# Patient Record
Sex: Male | Born: 1935 | Race: White | Hispanic: No | Marital: Married
Health system: Midwestern US, Academic
[De-identification: ages and names within clinical notes are randomized; demographics above are authoritative.]

---

## 2017-05-07 MED ORDER — PRADAXA 150 MG PO CAP
ORAL_CAPSULE | Freq: Two times a day (BID) | 11 refills | Status: DC
Start: 2017-05-07 — End: 2018-05-13

## 2017-08-12 ENCOUNTER — Encounter: Admit: 2017-08-12 | Discharge: 2017-08-12 | Payer: MEDICARE

## 2017-08-12 MED ORDER — METOPROLOL TARTRATE 50 MG PO TAB
ORAL_TABLET | Freq: Two times a day (BID) | ORAL | 3 refills | 90.00000 days | Status: AC
Start: 2017-08-12 — End: 2018-07-28

## 2017-08-25 ENCOUNTER — Encounter: Admit: 2017-08-25 | Discharge: 2017-08-25 | Payer: MEDICARE

## 2017-08-26 MED ORDER — ATORVASTATIN 40 MG PO TAB
ORAL_TABLET | Freq: Every day | 3 refills | Status: AC
Start: 2017-08-26 — End: 2018-09-23

## 2017-09-03 ENCOUNTER — Encounter: Admit: 2017-09-03 | Discharge: 2017-09-03 | Payer: MEDICARE

## 2017-09-03 DIAGNOSIS — E785 Hyperlipidemia, unspecified: Secondary | ICD-10-CM

## 2017-09-03 DIAGNOSIS — I480 Paroxysmal atrial fibrillation: ICD-10-CM

## 2017-09-03 DIAGNOSIS — I10 Essential (primary) hypertension: Principal | ICD-10-CM

## 2017-09-09 ENCOUNTER — Encounter: Admit: 2017-09-09 | Discharge: 2017-09-09 | Payer: MEDICARE

## 2017-09-09 DIAGNOSIS — I480 Paroxysmal atrial fibrillation: ICD-10-CM

## 2017-09-09 DIAGNOSIS — E785 Hyperlipidemia, unspecified: Secondary | ICD-10-CM

## 2017-09-09 DIAGNOSIS — I10 Essential (primary) hypertension: Principal | ICD-10-CM

## 2017-09-09 LAB — HEMOGLOBIN A1C: Lab: 7.6 — ABNORMAL HIGH (ref 4.5–6.5)

## 2017-09-09 LAB — LIPID PROFILE
Lab: 123
Lab: 114 — ABNORMAL LOW (ref 150–200)

## 2017-09-09 LAB — BASIC METABOLIC PANEL
Lab: 8.8
Lab: 1.1
Lab: 105
Lab: 142
Lab: 15 — ABNORMAL HIGH (ref 0–14)
Lab: 161 — ABNORMAL HIGH (ref 83–110)
Lab: 23
Lab: 26
Lab: 3.7

## 2017-09-09 LAB — ALT (SGPT): Lab: 16

## 2017-09-19 ENCOUNTER — Encounter: Admit: 2017-09-19 | Discharge: 2017-09-19 | Payer: MEDICARE

## 2017-09-19 ENCOUNTER — Ambulatory Visit: Admit: 2017-09-19 | Discharge: 2017-09-20 | Payer: MEDICARE

## 2017-09-19 DIAGNOSIS — E119 Type 2 diabetes mellitus without complications: ICD-10-CM

## 2017-09-19 DIAGNOSIS — N400 Enlarged prostate without lower urinary tract symptoms: ICD-10-CM

## 2017-09-19 DIAGNOSIS — I4891 Unspecified atrial fibrillation: ICD-10-CM

## 2017-09-19 DIAGNOSIS — R42 Dizziness and giddiness: Principal | ICD-10-CM

## 2017-09-19 DIAGNOSIS — E785 Hyperlipidemia, unspecified: Secondary | ICD-10-CM

## 2017-09-19 DIAGNOSIS — I10 Essential (primary) hypertension: ICD-10-CM

## 2017-09-19 DIAGNOSIS — I4821 Permanent atrial fibrillation: Principal | ICD-10-CM

## 2017-09-19 NOTE — Progress Notes
Date of Service: 09/19/2017    Miguel Howe is a 83 y.o. male.       HPI     [name] is followed for persistent atrial fibrillation/flutter.    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    Otherwise, over the past 6???months he has been stable. I am not sure that he has any symptoms related to his atrial fibrillation. He checks his blood pressure and pule at home daily and his computer keeps a running average of his heart rate and pulse.  His blood pressure has been a little lower than previous probably because he is watching his salt intake very well.  He keeps a running average of his blood pressure readings and his systolic blood pressure has been averaging 115 mmHg with a pulse of 80 bpm.???He has felt well and reports no angina, congestive symptoms, palpitations, sensation of sustained forceful heart pounding, lightheadedness or syncope. His exercise tolerance has been stable. He is a competitive Psychiatrist and usually gets a lot of exercise walking back and forth between horseshoe poles. 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 He even has a practice area in his basement for pitching horseshoes.  However the patient states that he is cutting back on his horseshoe pitching somewhat, because long matches become tiresome to him.??????[name] is also active bowling and golfing.  In the summer he golfs twice a week.  The patient reports no myalgias, bleeding abnormalities, neurologic motor abnormalities or difficulty with speech. [name] reports no recent hospitalizations or emergency room visits over the past 6 months. He has been tolerating his medications without difficulty.   ???  Historically, [name] presented to medical attention on August 08, 2010 when he noticed unsteadiness and dizziness while playing golf. He reports that the first 9 holes of golf that he played were unremarkable, and that he became unsteady when he started playing the back 9. At the time he believes that he may have had some focal weakness in his left leg and some numbness in his left leg as well. This persisted only for 10-15 minutes and he believes that he did not mention it in the Emergency Department because he has intermittent sciatica and lower back pain and attributed the abnormality to his chronic back disorder. He stopped playing golf and went to the Emergency Department and was seen in the Emergency Department at approximately 12:45 that afternoon. He was noted to have supraventricular tachycardia with a rate of 168 beats per minute. The Emergency Department note indicated that his supraventricular tachycardia converted spontaneously with an involuntary Valsalva that occurred while an IV was being placed. Mr. Tomek reported no other focal neurologic abnormalities. He reported no change in vision or difficulty with speech. 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 When I saw [name] on 08/15/10 his BP was elevated and he was started on losartan 50 mg daily. His BP was still elevated on follow-up and his losartan was increased to 100 mg daily. On 08/23/10 he reported intermittent tingling involving his left hand and he was hospitalized at [facility] for stroke evaluation. Prior to discharge on 08/26/10 he was noted to have paroxysmal atrial flutter and he was started on Pradaxa. [name] reported some transient  hematuria in October 2011. He reports that he was seen by his urologist and his hematuria resolved. When I saw [name] on 03/16/11 I noticed that his BP had been lower and he reported that he felt somewhat lethargic and orthostatic when his BP was lower. His Maxzide was stopped. Also his blood sugar was markedly elevated at 555 mg/dl. His metformin was increased and he markedly decreased his carbohydrate intake.   While [name] is unclear clinically whether he has had a stroke, a CT head scan from 08/24/10 showed MILD INVOLUTIONAL CHANGES AND SMALL SUBACUTE TO CHRONIC RIGHT BASAL GANGLIA LACUNAR INFARCT. In addition, the neurology discharge note from 08/27/10 lists subacute to chronic stroke as a discharge diagnosis. This along with age, hypertension and diabetes would yield a CHA2DS2-VASc score of 6. 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 ???When he was seen on 10/06/15 he was noted to be in atrial fibrillation. Treatment alternatives were presented to the patient and he wanted to proceed with cardioversion. Cardioversion was attempted on 10/10/15 but was unsuccessful. ???He has selected a strategy of heart rate control and anticoagulation and does not want to attempt repeat cardioversion or consider antiarrhythmic therapy or arrhythmia ablation.         Vitals:    09/19/17 1042 09/19/17 1052   BP: 106/74 110/78   Pulse: 91    Weight: 95.4 kg (210 lb 6.4 oz)    Height: 1.803 m (5' 11)      Body mass index is 29.34 kg/m???.     Past Medical History  Patient Active Problem List    Diagnosis Date Noted   ??? Paroxysmal atrial flutter (HCC) 07/27/2014   ??? Left arm numbness 08/23/2010   ??? Arrhythmia 08/23/2010   ??? HTN (hypertension) 08/23/2010   ??? Dizziness 08/15/2010   ??? HTN (hypertension) 08/15/2010   ??? Hyperlipoproteinemia 08/15/2010   ??? Atrial fibrillation (HCC) 08/15/2010   ??? Diabetes (HCC) 08/15/2010   ??? BPH (benign prostatic hypertrophy) 08/15/2010   ??? Dyslipidemia 08/15/2010         Review of Systems   Constitution: Positive for weight gain.   HENT: Negative.    Eyes: Negative.    Cardiovascular: Positive for dyspnea on exertion.   Endocrine: Negative.    Hematologic/Lymphatic: Negative.    Skin: Negative.    Musculoskeletal: Negative.    Gastrointestinal: Negative. Genitourinary: Negative.    Neurological: Negative.    Psychiatric/Behavioral: Negative.    Allergic/Immunologic: Negative.        Physical Exam  GENERAL: The patient is well developed, well nourished, resting comfortably and in no distress. ???  HEENT: No abnormalities of the visible oro-nasopharynx, conjunctiva or sclera are noted. ???  NECK: There is no jugular venous distension. Carotids are palpable and without bruits. There is no thyroid enlargement. ???  Chest: Lung fields are clear to auscultation. There are no wheezes or crackles. ???  CV: There is an??? irregular rhythm with an apical rate of 84???BPM. Variable intensity of S1. There are no murmurs, gallops or rubs. ???  ABD: The abdomen is soft and supple with normal bowel sounds. There is no hepatosplenomegaly, ascites, tenderness, masses or bruits. Ventral hernia noted.  Neuro: There are no focal motor defects. Ambulation is normal. Cognitive function appears normal. ???  Ext:???There is trace bipedal edema without evidence of deep vein thrombosis. Peripheral pulses are satisfactory. ???  SKIN:???There are no rashes and no cellulitis ???  PSYCH:???The patient is calm, rationale and oriented.     Cardiovascular Studies  A 12-lead ECG obtained on 09/19/2017 reveals atrial fibrillation with a heart rate of 91 bpm.  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  No significant changes are noted when compared to a prior ECG obtained on March 05, 2017.  Labs from 09/09/2017 reveals total cholesterol 114, triglycerides 123, HDL 37, and LDL 56 mg/dL.  His ALT is 16 and his hemoglobin A1c is 7.6%.  Serum potassium is 3.7 mmol/L.    Problems Addressed Today  Permanent atrial fibrillation  Assessment and Plan     Treatment options for the management of hypertension were reviewed with the patient he wanted to stop his amlodipine because his blood pressure has been low, although he has not been orthostatic and has had no falls. I once again discussed treatment alternatives with Mr. Kuper which included antiarrhythmic therapy, cardioversion, arrhythmia ablation, and heart rate control with anticoagulation. Mr. Tessmann feels that he is asymptomatic with respect to his atrial fibrillation and does not want to consider antiarrhythmic therapy, repeat cardioversion, or arrhythmia ablation. He prefers a strategy of heart rate control and anticoagulation. Currently he is taking Pradaxa and his heart rate is controlled well.??????The risks and benefits of anticoagulation therapy have been reviewed with the patient. The patient understands that anticoagulation is used to decrease thrombotic or clotting complications associated with atrial fibrillation/flutter, such as stroke and systemic embolization which can be disabling or fatal, but can, on occasion, lead to life-threatening bleeding complications including gastrointestinal and intracranial hemorrhage. The patient wishes to continue with anticoagulation. Anticoagulation options were presented to the patient which included warfarin or one of the newer direct acting oral anticoagulants and the patient wanted to continue taking Pradaxa since he has been tolerating this without adverse effects. He indicated that he will continue to work with his primary care physician for management of his diabetes mellitus and blood sugars.  I have asked Mr. Depa to return for follow-up in 6 months so that I can follow his progress.         Current Medications (including today's revisions)  ??? amoxicillin (AMOXIL) 500 mg capsule Take 500 mg by mouth. Take as needed for dental appt.    ??? atorvastatin (LIPITOR) 40 mg tablet TAKE ONE TABLET BY MOUTH ONCE DAILY   ??? clotrimazole-betamethasone (LOTRISONE) 1-0.05 % topical cream Apply  topically to affected area daily.   ??? coenzyme Q10(+) 100 mg PO Cap Take 200 mg by mouth daily.   ??? finasteride (PROSCAR) 5 mg tablet Take 5 mg by mouth daily. ??? glimepiride (AMARYL) 4 mg tablet Take 6 mg by mouth daily.   ??? losartan-hydrochlorothiazide (HYZAAR) 100-25 mg tablet TAKE ONE TABLET BY MOUTH ONCE DAILY   ??? metoprolol tartrate (LOPRESSOR) 50 mg tablet TAKE ONE TABLET BY MOUTH TWICE DAILY   ??? MULTIVITAMIN W-MINERALS/LUTEIN (CENTRUM SILVER PO) Take  by mouth daily.   ??? pioglitazone (ACTOS) 30 mg tablet Take 30 mg by mouth daily.   ??? PRADAXA 150 mg capsule TAKE ONE CAPSULE BY MOUTH TWICE DAILY   ??? tamsulosin (FLOMAX) 0.4 mg capsule Take 0.4 mg by mouth daily.

## 2017-11-22 ENCOUNTER — Encounter: Admit: 2017-11-22 | Discharge: 2017-11-22 | Payer: MEDICARE

## 2017-11-22 MED ORDER — LOSARTAN-HYDROCHLOROTHIAZIDE 100-25 MG PO TAB
ORAL_TABLET | Freq: Every day | ORAL | 3 refills | 28.00000 days | Status: AC
Start: 2017-11-22 — End: 2018-11-24

## 2018-04-22 ENCOUNTER — Encounter: Admit: 2018-04-22 | Discharge: 2018-04-22 | Payer: MEDICARE

## 2018-04-22 ENCOUNTER — Ambulatory Visit: Admit: 2018-04-22 | Discharge: 2018-04-23 | Payer: MEDICARE

## 2018-04-22 DIAGNOSIS — R42 Dizziness and giddiness: ICD-10-CM

## 2018-04-22 DIAGNOSIS — N400 Enlarged prostate without lower urinary tract symptoms: ICD-10-CM

## 2018-04-22 DIAGNOSIS — I4891 Unspecified atrial fibrillation: ICD-10-CM

## 2018-04-22 DIAGNOSIS — I10 Essential (primary) hypertension: Principal | ICD-10-CM

## 2018-04-22 DIAGNOSIS — E119 Type 2 diabetes mellitus without complications: ICD-10-CM

## 2018-04-22 DIAGNOSIS — I4821 Permanent atrial fibrillation: ICD-10-CM

## 2018-04-22 DIAGNOSIS — E785 Hyperlipidemia, unspecified: Secondary | ICD-10-CM

## 2018-04-22 LAB — BASIC METABOLIC PANEL
Lab: 1.3 — ABNORMAL HIGH (ref 0.72–1.25)
Lab: 103
Lab: 138
Lab: 15 — ABNORMAL HIGH (ref 0–14)
Lab: 24
Lab: 25
Lab: 280 — ABNORMAL HIGH (ref 83–110)
Lab: 4
Lab: 8.9

## 2018-04-23 ENCOUNTER — Encounter: Admit: 2018-04-23 | Discharge: 2018-04-23 | Payer: MEDICARE

## 2018-04-23 DIAGNOSIS — I10 Essential (primary) hypertension: Principal | ICD-10-CM

## 2018-04-23 DIAGNOSIS — I4821 Permanent atrial fibrillation: ICD-10-CM

## 2018-04-23 DIAGNOSIS — R42 Dizziness and giddiness: ICD-10-CM

## 2018-05-06 ENCOUNTER — Ambulatory Visit: Admit: 2018-05-06 | Discharge: 2018-05-06 | Payer: MEDICARE

## 2018-05-06 DIAGNOSIS — I482 Chronic atrial fibrillation: ICD-10-CM

## 2018-05-06 DIAGNOSIS — I10 Essential (primary) hypertension: Principal | ICD-10-CM

## 2018-05-06 DIAGNOSIS — R42 Dizziness and giddiness: ICD-10-CM

## 2018-05-06 MED ORDER — PERFLUTREN LIPID MICROSPHERES 1.1 MG/ML IV SUSP
1-20 mL | Freq: Once | INTRAVENOUS | 0 refills | Status: CP | PRN
Start: 2018-05-06 — End: ?
  Administered 2018-05-06: 16:00:00 2 mL via INTRAVENOUS

## 2018-05-07 ENCOUNTER — Encounter: Admit: 2018-05-07 | Discharge: 2018-05-07 | Payer: MEDICARE

## 2018-05-10 ENCOUNTER — Encounter: Admit: 2018-05-10 | Discharge: 2018-05-10 | Payer: MEDICARE

## 2018-05-13 MED ORDER — PRADAXA 150 MG PO CAP
ORAL_CAPSULE | Freq: Two times a day (BID) | 11 refills | Status: AC
Start: 2018-05-13 — End: 2019-05-04

## 2018-05-15 ENCOUNTER — Encounter: Admit: 2018-05-15 | Discharge: 2018-05-15 | Payer: MEDICARE

## 2018-07-26 ENCOUNTER — Encounter: Admit: 2018-07-26 | Discharge: 2018-07-26 | Payer: MEDICARE

## 2018-07-28 MED ORDER — METOPROLOL TARTRATE 50 MG PO TAB
ORAL_TABLET | Freq: Two times a day (BID) | ORAL | 3 refills | 90.00000 days | Status: AC
Start: 2018-07-28 — End: 2019-08-18

## 2018-08-29 LAB — LIPID PROFILE
Lab: 124 — ABNORMAL LOW (ref 150–200)
Lab: 173
Lab: 34 — ABNORMAL LOW (ref 35–60)
Lab: 35
Lab: 4
Lab: 57

## 2018-08-29 LAB — HEMOGLOBIN A1C: Lab: 7.7 — ABNORMAL HIGH (ref 4.5–6.5)

## 2018-09-23 ENCOUNTER — Encounter: Admit: 2018-09-23 | Discharge: 2018-09-23 | Payer: MEDICARE

## 2018-09-23 MED ORDER — ATORVASTATIN 40 MG PO TAB
ORAL_TABLET | Freq: Every day | 1 refills | Status: AC
Start: 2018-09-23 — End: 2019-03-02

## 2018-11-24 ENCOUNTER — Encounter: Admit: 2018-11-24 | Discharge: 2018-11-24 | Payer: MEDICARE

## 2018-11-24 MED ORDER — LOSARTAN-HYDROCHLOROTHIAZIDE 100-25 MG PO TAB
ORAL_TABLET | Freq: Every day | ORAL | 3 refills | 28.00000 days | Status: AC
Start: 2018-11-24 — End: 2019-01-29

## 2019-01-28 LAB — LIPID PROFILE
Lab: 125 — ABNORMAL LOW (ref 150–200)
Lab: 126
Lab: 25 — ABNORMAL HIGH (ref 0.72–1.25)
Lab: 66 — ABNORMAL HIGH (ref 27.0–31.0)

## 2019-01-28 LAB — COMPREHENSIVE METABOLIC PANEL
Lab: 14
Lab: 142
Lab: 19
Lab: 24
Lab: 49 — ABNORMAL LOW (ref >59)

## 2019-01-28 LAB — CBC: Lab: 6.6

## 2019-01-28 LAB — THYROID STIMULATING HORMONE-TSH: Lab: 3.7

## 2019-01-29 ENCOUNTER — Encounter: Admit: 2019-01-29 | Discharge: 2019-01-29 | Payer: MEDICARE

## 2019-01-29 ENCOUNTER — Ambulatory Visit: Admit: 2019-01-29 | Discharge: 2019-01-29 | Payer: MEDICARE

## 2019-01-29 DIAGNOSIS — I4891 Unspecified atrial fibrillation: ICD-10-CM

## 2019-01-29 DIAGNOSIS — R42 Dizziness and giddiness: Principal | ICD-10-CM

## 2019-01-29 DIAGNOSIS — N400 Enlarged prostate without lower urinary tract symptoms: ICD-10-CM

## 2019-01-29 DIAGNOSIS — E785 Hyperlipidemia, unspecified: Secondary | ICD-10-CM

## 2019-01-29 DIAGNOSIS — E119 Type 2 diabetes mellitus without complications: ICD-10-CM

## 2019-01-29 DIAGNOSIS — I4821 Permanent atrial fibrillation: Principal | ICD-10-CM

## 2019-01-29 DIAGNOSIS — I10 Essential (primary) hypertension: ICD-10-CM

## 2019-01-29 NOTE — Progress Notes
believes that he may have had some focal weakness in his left leg and some numbness in his left leg as well. This persisted only for 10-15 minutes and he believes that he did not mention it in the Emergency Department because he has intermittent sciatica and lower back pain and attributed the abnormality to his chronic back disorder. He stopped playing golf and went to the Emergency Department and was seen in the Emergency Department at approximately 12:45 that afternoon. He was noted to have supraventricular tachycardia with a rate of 168 beats per minute. The Emergency Department note indicated that his supraventricular tachycardia converted spontaneously with an involuntary Valsalva that occurred while an IV was being placed. Mr. Pitner reported no other focal neurologic abnormalities. He reported no change in vision or difficulty with speech. 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 When I saw Mr. Denio on 08/15/10 his BP was elevated and he was started on losartan 50 mg daily. His BP was still elevated on follow-up and his losartan was increased to 100 mg daily. On 08/23/10 he reported intermittent tingling involving his left hand and he was hospitalized at [facility] for stroke evaluation. Prior to discharge on 08/26/10 he was noted to have paroxysmal atrial flutter and he was started on Pradaxa. Mr. Carreras reported some transient hematuria in October 2011. He reports that he was seen by his urologist and his hematuria resolved. When I saw Mr. Trulson on 03/16/11 I noticed that his BP had been lower and he reported that he felt somewhat lethargic and orthostatic when his BP was lower. His Maxzide was stopped. Also his blood sugar was markedly elevated at 555 mg/dl. His metformin was increased and he markedly decreased his carbohydrate intake. lower limits of normal. The estimated left ventricular ejection fraction is 50-55%. Left ventricular contractility appears similar when compared with the prior echocardiogram performed on 10/19/15.  2. There is mild concentric left ventricular hypertrophy.  3. Right ventricular contractility appears normal.  4. Moderate left atrial enlargement.  5. Mild mitral valve regurgitation.  6. Aortic valve sclerosis without stenosis.  7. No pericardial effusion is seen.    Problems Addressed Today  Permanent atrial fibrillation.  Assessment and Plan     Mr.Demmon has permanent atrial fibrillation and has declined rhythm control in preference for anticoagulation and heart rate control.  The patient reports that his average heart rate when he checks at home is 83 bpm.  He reports some mild gradual fatigue over time but his exercise tolerance remains greater than expected for his age. .For this patient a prior history of stroke, age, hypertension and diabetes would yield a CHA2DS2-VASc score of 6. The risks and benefits of anticoagulation therapy have been reviewed with the patient. The patient understands that anticoagulation is used to decrease thrombotic or clotting complications associated with atrial fibrillation/flutter, such as stroke and systemic embolization which can be disabling or fatal, but can, on occasion, lead to life-threatening bleeding complications including gastrointestinal or intracranial hemorrhage. The patient wishes to continue with anticoagulation. Anticoagulation options were presented to the patient which included warfarin or one of the newer direct acting oral anticoagulants and the patient wanted to continue Pradaxa. I spoke to Mr. Valeriano about reducing some of his antihypertensive medications because his blood pressure is occasionally low.  He indicated that he was feeling  very well, had infrequent orthostatic symptoms and did not want to change his

## 2019-02-22 ENCOUNTER — Encounter: Admit: 2019-02-22 | Discharge: 2019-02-22 | Payer: MEDICARE

## 2019-02-23 MED ORDER — LOSARTAN 100 MG PO TAB
ORAL_TABLET | Freq: Every day | ORAL | 3 refills | 30.00000 days | Status: AC
Start: 2019-02-23 — End: 2020-02-05

## 2019-02-23 MED ORDER — HYDROCHLOROTHIAZIDE 25 MG PO TAB
ORAL_TABLET | Freq: Every day | ORAL | 3 refills | 28.00000 days | Status: AC
Start: 2019-02-23 — End: 2020-02-05

## 2019-03-01 ENCOUNTER — Encounter: Admit: 2019-03-01 | Discharge: 2019-03-01 | Payer: MEDICARE

## 2019-03-02 MED ORDER — ATORVASTATIN 40 MG PO TAB
ORAL_TABLET | Freq: Every day | 3 refills | Status: AC
Start: 2019-03-02 — End: 2020-02-16

## 2019-05-03 ENCOUNTER — Encounter: Admit: 2019-05-03 | Discharge: 2019-05-03 | Payer: MEDICARE

## 2019-05-04 MED ORDER — PRADAXA 150 MG PO CAP
ORAL_CAPSULE | Freq: Two times a day (BID) | 11 refills | Status: DC
Start: 2019-05-04 — End: 2020-04-25

## 2019-08-04 ENCOUNTER — Encounter: Admit: 2019-08-04 | Discharge: 2019-08-04 | Payer: MEDICARE

## 2019-08-04 ENCOUNTER — Ambulatory Visit: Admit: 2019-08-04 | Discharge: 2019-08-05 | Payer: MEDICARE

## 2019-08-04 DIAGNOSIS — E785 Hyperlipidemia, unspecified: Secondary | ICD-10-CM

## 2019-08-04 DIAGNOSIS — I4891 Unspecified atrial fibrillation: Secondary | ICD-10-CM

## 2019-08-04 DIAGNOSIS — R42 Dizziness and giddiness: Secondary | ICD-10-CM

## 2019-08-04 DIAGNOSIS — N400 Enlarged prostate without lower urinary tract symptoms: Secondary | ICD-10-CM

## 2019-08-04 DIAGNOSIS — I10 Essential (primary) hypertension: Secondary | ICD-10-CM

## 2019-08-04 DIAGNOSIS — E119 Type 2 diabetes mellitus without complications: Secondary | ICD-10-CM

## 2019-08-04 DIAGNOSIS — I4892 Unspecified atrial flutter: Secondary | ICD-10-CM

## 2019-08-04 LAB — BASIC METABOLIC PANEL
Lab: 1.4 — ABNORMAL HIGH (ref 0.72–1.25)
Lab: 104 mg/dL
Lab: 141 mg/dL — ABNORMAL HIGH (ref <100)
Lab: 16 — ABNORMAL HIGH (ref 0–14)
Lab: 183 — ABNORMAL HIGH (ref 70–105)
Lab: 21
Lab: 25
Lab: 4 mg/dL
Lab: 8.7 — ABNORMAL LOW (ref 8.8–10.0)

## 2019-08-04 NOTE — Progress Notes
Date of Service: 08/04/2019    Miguel Howe is a 85 y.o. male.       HPI     [name] is followed for persistent atrial fibrillation/flutter.??????He reports no febrile or infectious symptoms recently.  Overall his exercise tolerance exceeds that expected for his age.  He is back to playing golf and hopes to resume his horseshoe activities within a short period of time. 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 ???Otherwise, over the past 6???months he has been stable. He checks his blood pressure and pulse at home daily and his computer keeps a running average of his heart rate and pulse. He keeps a running average of his blood pressure readings???and his blood pressure???has???been averaging 119/72???mmHg with a pulse of 80???bpm.???He has felt well and reports no angina, congestive symptoms, palpitations, sensation of sustained forceful heart pounding, lightheadedness or syncope. His exercise tolerance???may be very slowly decreasing with age. 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 He even has a practice area in his basement for pitching horseshoes. ???However the patient states that he is cutting back on his horseshoe pitching somewhat, because long matches become tiresome to him.??????Miguel Howe is also active bowling and golfing. The patient reports no myalgias, bleeding abnormalities, neurologic motor abnormalities or difficulty with speech. Miguel Howe reports no recent hospitalizations or emergency room visits over the past 6 months. He has been tolerating his medications without difficulty.   ???  Historically, [name] presented to medical attention on August 08, 2010 when he noticed unsteadiness and dizziness while playing golf. He reports that the first 9 holes of golf that he played were unremarkable, and that he became unsteady when he started playing the back 9. At the time he believes that he may have had some focal weakness in his left leg and some numbness in his left leg as well. This persisted only for 10-15 minutes and he believes that he did not mention it in the Emergency Department because he has intermittent sciatica and lower back pain and attributed the abnormality to his chronic back disorder. He stopped playing golf and went to the Emergency Department and was seen in the Emergency Department at approximately 12:45 that afternoon. He was noted to have supraventricular tachycardia with a rate of 168 beats per minute. The Emergency Department note indicated that his supraventricular tachycardia converted spontaneously with an involuntary Valsalva that occurred while an IV was being placed. [name] reported no other focal neurologic abnormalities. He reported no change in vision or difficulty with speech. 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 When I saw Miguel Howe on 08/15/10 his BP was elevated and he was started on losartan 50 mg daily. His BP was still elevated on follow-up and his losartan was increased to 100 mg daily. On 08/23/10 he reported intermittent tingling involving his left hand and he was hospitalized at [facility] for stroke evaluation. Prior to discharge on 08/26/10 he was noted to have paroxysmal atrial flutter and he was started on Pradaxa. Miguel Howe reported some transient hematuria in October 2011. He reports that  he was seen by his urologist and his hematuria resolved. When I saw [name] on 03/16/11 I noticed that his BP had been lower and he reported that he felt somewhat lethargic and orthostatic when his BP was lower. His Maxzide was stopped. Also his blood sugar was markedly elevated at 555 mg/dl. His metformin was increased and he markedly decreased his carbohydrate intake.   While Miguel Howe is unclear clinically whether he has had a stroke, a CT head scan from 08/24/10 showed MILD INVOLUTIONAL CHANGES AND SMALL SUBACUTE TO CHRONIC RIGHT BASAL GANGLIA LACUNAR INFARCT. In addition, the neurology discharge note from 08/27/10 lists subacute to chronic stroke as a discharge diagnosis. This along with age, hypertension and diabetes would yield a CHA2DS2-VASc score of 6. 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 ???When he was seen on 10/06/15 he was noted to be in atrial fibrillation. Treatment alternatives were presented to the patient and he wanted to proceed with cardioversion. Cardioversion was attempted on 10/10/15 but was unsuccessful. ???He has selected a strategy of heart rate control and anticoagulation and does not want to attempt repeat cardioversion or consider antiarrhythmic therapy or arrhythmia ablation.       Vitals:    08/04/19 1525 08/04/19 1538   BP: 122/76 126/74   BP Source: Arm, Left Upper Arm, Right Upper   Pulse: 80    Temp: 36.9 ???C (98.4 ???F)    SpO2: 97%    Weight: 92.4 kg (203 lb 9.6 oz)    Height: 1.791 m (5' 10.5)    PainSc: Zero      Body mass index is 28.8 kg/m???.     Past Medical History  Patient Active Problem List    Diagnosis Date Noted   ??? Paroxysmal atrial flutter (HCC) 07/27/2014   ??? Left arm numbness 08/23/2010   ??? Arrhythmia 08/23/2010   ??? HTN (hypertension) 08/23/2010   ??? Dizziness 08/15/2010   ??? HTN (hypertension) 08/15/2010   ??? Hyperlipoproteinemia 08/15/2010   ??? Atrial fibrillation (HCC) 08/15/2010   ??? Diabetes (HCC) 08/15/2010   ??? BPH (benign prostatic hypertrophy) 08/15/2010   ??? Dyslipidemia 08/15/2010         Review of Systems   Constitution: Negative.   HENT: Negative.    Eyes: Negative. Cardiovascular: Positive for dyspnea on exertion.   Respiratory: Negative.    Endocrine: Negative.    Hematologic/Lymphatic: Bruises/bleeds easily.   Skin: Negative.    Musculoskeletal: Positive for back pain.   Gastrointestinal: Negative.    Genitourinary: Negative.    Neurological: Positive for numbness and paresthesias.   Psychiatric/Behavioral: Negative.    Allergic/Immunologic: Negative.      Physical Exam  GENERAL: The patient is well developed, well nourished, resting comfortably and in no distress. ???  HEENT: No abnormalities of the visible oro-nasopharynx, conjunctiva or sclera are noted. ???  NECK: There is no jugular venous distension. Carotids are palpable and without bruits. There is no thyroid enlargement. ???  Chest: Lung fields are clear to auscultation. There are no wheezes or crackles. ???  CV: There is an??? irregular rhythm with an apical rate of 80???BPM. Variable intensity of S1. There are no murmurs, gallops or rubs. ???  ABD: The abdomen is soft and supple with normal bowel sounds. There is no hepatosplenomegaly, ascites, tenderness, masses or bruits. Ventral hernia noted.  Neuro: There are no focal motor defects. Ambulation is normal. Cognitive function appears normal. ???  Ext:???There is trace bipedal edema without evidence of deep vein thrombosis. Peripheral pulses are satisfactory. ???  SKIN:???There are no rashes and no cellulitis ???  PSYCH:???The patient is calm, rationale and oriented.    Cardiovascular Studies  A twelve-lead ECG obtained on 08/04/2019 reveals atrial fibrillation with a heart rate of 90 bpm.  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  There is no appreciable change when compared with a prior ECG obtained on January 29, 2019.    Labs from January 28, 2019 revealed serum potassium 3.7 mmol/L and serum creatinine 1.44 mg/dL.  His total cholesterol was 125, triglycerides 126, HDL 39 and LDL cholesterol 66 mg/dL.  His TSH was normal at 3.71 microunits/mL.  His ALT = 19. Based upon his age and serum creatinine of 1.44 mg/dL, his estimated creatinine clearance is 53 mL/min.       Echo Doppler 05/06/2018:  1. All left ventricular segments appear to contract at the lower limits of normal. ???Overall left ventricular systolic function appears to be at the lower limits of normal. The estimated left ventricular ejection fraction is 50-55%. Left ventricular contractility appears similar when compared with the prior echocardiogram performed on 10/19/15.  2. There is mild concentric left ventricular hypertrophy.  3. Right ventricular contractility appears normal.  4. Moderate left atrial enlargement.  5. Mild mitral valve regurgitation.  6. Aortic valve sclerosis without stenosis.  7. No pericardial effusion is seen    Problems Addressed Today  Permanent atrial fibrillation.  Hypertension.  Assessment and Plan     MiguelBowron???has permanent atrial fibrillation and has declined rhythm control in???preference for anticoagulation and heart rate control. ???The patient reports that his average heart rate when he checks at home is 80 bpm. ???He reports some mild gradual fatigue over time but his exercise tolerance remains greater than expected for his age. .For this???patient a prior history of stroke,???age, hypertension and diabetes would yield a CHA2DS2-VASc score of 6.???The risks and benefits of anticoagulation therapy have been reviewed with the patient. The patient understands that anticoagulation is used to decrease thrombotic or clotting complications associated with atrial fibrillation/flutter, such as stroke and systemic embolization which can be disabling or fatal, but can, on occasion, lead to life-threatening bleeding complications including???gastrointestinal or???intracranial hemorrhage. The patient wishes to???continue???with anticoagulation. Anticoagulation options were presented to the patient which included warfarin or one of the newer direct acting oral anticoagulants and the patient wanted to???continue Pradaxa.  He was given a requisition to check his Chem-7. I have asked Mr. Biskup to???return for follow-up in approximately 6 months time. ???       Current Medications (including today's revisions)  ??? amoxicillin (AMOXIL) 500 mg capsule Take 500 mg by mouth. Take as needed for dental appt.    ??? atorvastatin (LIPITOR) 40 mg tablet Take 1 tablet by mouth once daily   ??? clotrimazole-betamethasone (LOTRISONE) 1-0.05 % topical cream Apply  topically to affected area daily.   ??? coenzyme Q10(+) 100 mg PO Cap Take 200 mg by mouth daily.   ??? finasteride (PROSCAR) 5 mg tablet Take 5 mg by mouth daily.     ??? glimepiride (AMARYL) 4 mg tablet Take 8 mg by mouth daily.   ??? hydroCHLOROthiazide (HYDRODIURIL) 25 mg tablet Take 1 tablet by mouth once daily   ??? JARDIANCE 10 mg tablet Take 1 tablet by mouth daily.   ??? losartan (COZAAR) 100 mg tablet Take 1 tablet by mouth once daily   ??? metoprolol tartrate (LOPRESSOR) 50  mg tablet TAKE 1 TABLET BY MOUTH TWICE DAILY   ??? MULTIVITAMIN W-MINERALS/LUTEIN (CENTRUM SILVER PO) Take  by mouth daily.   ??? pioglitazone (ACTOS) 30 mg tablet Take 30 mg by mouth daily.   ??? PRADAXA 150 mg capsule Take 1 capsule by mouth twice daily   ??? tamsulosin (FLOMAX) 0.4 mg capsule Take 0.4 mg by mouth daily.

## 2019-08-06 ENCOUNTER — Encounter: Admit: 2019-08-06 | Discharge: 2019-08-06

## 2019-08-06 DIAGNOSIS — I4892 Unspecified atrial flutter: Secondary | ICD-10-CM

## 2019-08-06 DIAGNOSIS — I10 Essential (primary) hypertension: Secondary | ICD-10-CM

## 2019-08-10 ENCOUNTER — Encounter: Admit: 2019-08-10 | Discharge: 2019-08-10

## 2019-08-10 NOTE — Telephone Encounter
-----   Message from Nehemiah Massed, MD sent at 08/07/2019  4:29 PM CDT -----  Sherlon Handing: Mr. Vitrano's renal function appears stable.  His blood sugar is running high for him.  Please let him know.  Please forward lab results to his PCP.  Thanks.  SBG  ----- Message -----  From: Betsy Pries, RN  Sent: 08/06/2019  11:15 AM CDT  To: Nehemiah Massed, MD    Labs for your review.

## 2019-08-10 NOTE — Telephone Encounter
Results and recommendations called to patient.

## 2019-08-17 ENCOUNTER — Encounter: Admit: 2019-08-17 | Discharge: 2019-08-17

## 2019-08-18 MED ORDER — METOPROLOL TARTRATE 50 MG PO TAB
ORAL_TABLET | Freq: Two times a day (BID) | ORAL | 3 refills | 90.00000 days | Status: AC
Start: 2019-08-18 — End: ?

## 2020-02-05 ENCOUNTER — Encounter: Admit: 2020-02-05 | Discharge: 2020-02-05 | Payer: MEDICARE

## 2020-02-05 MED ORDER — LOSARTAN 100 MG PO TAB
ORAL_TABLET | Freq: Every day | ORAL | 3 refills | 30.00000 days | Status: DC
Start: 2020-02-05 — End: 2020-02-05

## 2020-02-05 MED ORDER — HYDROCHLOROTHIAZIDE 25 MG PO TAB
ORAL_TABLET | Freq: Every day | ORAL | 3 refills | 28.00000 days | Status: DC
Start: 2020-02-05 — End: 2020-02-05

## 2020-02-05 MED ORDER — HYDROCHLOROTHIAZIDE 25 MG PO TAB
ORAL_TABLET | Freq: Every day | ORAL | 3 refills | 28.00000 days | Status: AC
Start: 2020-02-05 — End: ?

## 2020-02-05 MED ORDER — LOSARTAN 100 MG PO TAB
ORAL_TABLET | Freq: Every day | ORAL | 3 refills | 30.00000 days | Status: AC
Start: 2020-02-05 — End: ?

## 2020-02-15 ENCOUNTER — Encounter: Admit: 2020-02-15 | Discharge: 2020-02-15 | Payer: MEDICARE

## 2020-02-16 ENCOUNTER — Encounter: Admit: 2020-02-16 | Discharge: 2020-02-16 | Payer: MEDICARE

## 2020-02-16 DIAGNOSIS — I4891 Unspecified atrial fibrillation: Secondary | ICD-10-CM

## 2020-02-16 DIAGNOSIS — I10 Essential (primary) hypertension: Secondary | ICD-10-CM

## 2020-02-16 DIAGNOSIS — R42 Dizziness and giddiness: Secondary | ICD-10-CM

## 2020-02-16 DIAGNOSIS — E119 Type 2 diabetes mellitus without complications: Secondary | ICD-10-CM

## 2020-02-16 DIAGNOSIS — E785 Hyperlipidemia, unspecified: Secondary | ICD-10-CM

## 2020-02-16 DIAGNOSIS — N400 Enlarged prostate without lower urinary tract symptoms: Secondary | ICD-10-CM

## 2020-02-16 DIAGNOSIS — I4892 Unspecified atrial flutter: Secondary | ICD-10-CM

## 2020-02-16 MED ORDER — ATORVASTATIN 40 MG PO TAB
ORAL_TABLET | Freq: Every day | ORAL | 3 refills | 90.00000 days | Status: AC
Start: 2020-02-16 — End: ?

## 2020-02-16 NOTE — Progress Notes
Date of Service: 02/16/2020    Miguel Howe is a 86 y.o. male.       HPI     [name] is followed for persistent atrial fibrillation/flutter.??He reports no febrile or infectious symptoms recently.  He has received his first Covid vaccine. Overall his exercise tolerance exceeds that expected for his age.  He has been an avid golfer and plans to start playing golf again this week as the weather becomes warm. 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 ?Otherwise, over the past 6?months he has been stable. He checks his blood pressure and pulse at home daily and his computer keeps a running average of his blood pressure and pulse.  His blood pressure averages 120/70 mmHg with a pulse of 80?bpm.?He has felt well and reports no angina, congestive symptoms, palpitations, sensation of sustained forceful heart pounding, lightheadedness or syncope. His exercise tolerance?may be very slowly decreasing with age but still is very good. ?The patient reports no myalgias, bleeding abnormalities, or strokelike symptoms.  [name] reports no recent hospitalizations or emergency room visits over the past 6 months. He has been tolerating his medications without difficulty.   ?  Historically, [name] presented to medical attention on August 08, 2010 when he noticed unsteadiness and dizziness while playing golf. He reports that the first 9 holes of golf that he played were unremarkable, and that he became unsteady when he started playing the back 9. At the time he believes that he may have had some focal weakness in his left leg and some numbness in his left leg as well. This persisted only for 10-15 minutes and he believes that he did not mention it in the Emergency Department because he has intermittent sciatica and lower back pain and attributed the abnormality to his chronic back disorder. He stopped playing golf and went to the Emergency Department and was seen in the Emergency Department at approximately 12:45 that afternoon. He was noted to have supraventricular tachycardia with a rate of 168 beats per minute. The Emergency Department note indicated that his supraventricular tachycardia converted spontaneously with an involuntary Valsalva that occurred while an IV was being placed. [name] reported no other focal neurologic abnormalities. He reported no change in vision or difficulty with speech. 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 When I saw [name] on 08/15/10 his BP was elevated and he was started on losartan 50 mg daily. His BP was still elevated on follow-up and his losartan was increased to 100 mg daily. On 08/23/10 he reported intermittent tingling involving his left hand and he was hospitalized at [facility] for stroke evaluation. Prior to discharge on 08/26/10 he was noted to have paroxysmal atrial flutter and he was started on Pradaxa. [name] reported some transient hematuria in October 2011. He reports that he was seen by his urologist and his hematuria resolved. When I saw [name] on 03/16/11 I noticed that his BP had been lower and he reported that he felt somewhat lethargic and orthostatic when his BP was lower. His Maxzide was stopped. Also his blood sugar was markedly elevated at 555  mg/dl. His metformin was increased and he markedly decreased his carbohydrate intake.   While [name] is unclear clinically whether he has had a stroke, a CT head scan from 08/24/10 showed MILD INVOLUTIONAL CHANGES AND SMALL SUBACUTE TO CHRONIC RIGHT BASAL GANGLIA LACUNAR INFARCT. In addition, the neurology discharge note from 08/27/10 lists subacute to chronic stroke as a discharge diagnosis. This along with age, hypertension and diabetes would yield a CHA2DS2-VASc score of 6. 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 ?When he was seen on 10/06/15 he was noted to be in atrial fibrillation. Treatment alternatives were presented to the patient and he wanted to proceed with cardioversion. Cardioversion was attempted on 10/10/15 but was unsuccessful. ?He has selected a strategy of heart rate control and anticoagulation and does not want to attempt repeat cardioversion or consider antiarrhythmic therapy or arrhythmia ablation.         Vitals:    02/16/20 1230 02/16/20 1236   BP: 112/72 114/76   BP Source: Arm, Left Upper Arm, Right Upper   Patient Position: Sitting Sitting   Pulse: 75    SpO2: 98%    Weight: 92 kg (202 lb 12.8 oz)    Height: 1.791 m (5' 10.5)    PainSc: Zero      Body mass index is 28.69 kg/m?[name]     Past Medical History  Patient Active Problem List    Diagnosis Date Noted   ? Paroxysmal atrial flutter (HCC) 07/27/2014   ? Left arm numbness 08/23/2010   ? Arrhythmia 08/23/2010   ? HTN (hypertension) 08/23/2010   ? Dizziness 08/15/2010   ? HTN (hypertension) 08/15/2010   ? Hyperlipoproteinemia 08/15/2010   ? Atrial fibrillation (HCC) 08/15/2010   ? Diabetes (HCC) 08/15/2010   ? BPH (benign prostatic hypertrophy) 08/15/2010   ? Dyslipidemia 08/15/2010         Review of Systems   Constitution: Positive for malaise/fatigue.   HENT: Negative.    Eyes: Negative.    Cardiovascular: Positive for dyspnea on exertion, irregular heartbeat and leg swelling.   Respiratory: Negative.    Endocrine: Negative.    Hematologic/Lymphatic: Negative.    Skin: Positive for itching.   Musculoskeletal: Positive for back pain.   Gastrointestinal: Negative.    Genitourinary: Negative.    Neurological: Positive for numbness and paresthesias.   Psychiatric/Behavioral: Negative. Allergic/Immunologic: Negative.        Physical Exam  GENERAL: The patient is well developed, well nourished, resting comfortably and in no distress. ?  HEENT: No abnormalities of the visible oro-nasopharynx, conjunctiva or sclera are noted. ?  NECK: There is no jugular venous distension. Carotids are palpable and without bruits. There is no thyroid enlargement. ?  Chest: Lung fields are clear to auscultation. There are no wheezes or crackles. ?  CV: There is an? irregular rhythm with an apical rate of 80?BPM. Variable intensity of S1. There are no murmurs, gallops or rubs. ?  ABD: The abdomen is soft and supple with normal bowel sounds. There is no hepatosplenomegaly, ascites, tenderness, masses or bruits. Ventral hernia noted.  Neuro: There are no focal motor defects. Ambulation is normal. Cognitive function appears normal. ?  Ext:?There is trace bipedal edema without evidence of deep vein thrombosis. Peripheral pulses are satisfactory. ?  SKIN:?There are no rashes and no cellulitis ?  PSYCH:?The patient is calm, rationale and oriented.    Cardiovascular Studies  A twelve-lead ECG obtained  on 02/16/2020 reveals atrial fibrillation with a heart rate of 83 bpm.  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    A twelve-lead ECG obtained on 08/04/2019 reveals atrial fibrillation with a heart rate of 90 bpm.  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  There is no appreciable change when compared with a prior ECG obtained on January 29, 2019.  ?  Labs from January 28, 2019 revealed serum potassium 3.7 mmol/L and serum creatinine 1.44 mg/dL.  His total cholesterol was 125, triglycerides 126, HDL 39 and LDL cholesterol 66 mg/dL.  His TSH was normal at 3.71 microunits/mL.  His ALT = 19. Based upon his age and serum creatinine of 1.44 mg/dL, his estimated creatinine clearance is 53?mL/min.     ?  Echo Doppler 05/06/2018:  1. All left ventricular segments appear to contract at the lower limits of normal. ?Overall left ventricular systolic function appears to be at the lower limits of normal. The estimated left ventricular ejection fraction is 50-55%. Left ventricular contractility appears similar when compared with the prior echocardiogram performed on 10/19/15.  2. There is mild concentric left ventricular hypertrophy.  3. Right ventricular contractility appears normal.  4. Moderate left atrial enlargement.  5. Mild mitral valve regurgitation.  6. Aortic valve sclerosis without stenosis.  7. No pericardial effusion is seen    Problems Addressed Today  Encounter Diagnoses   Name Primary?   ? Essential hypertension    ? Paroxysmal atrial flutter (HCC)      Assessment and Plan     Miguel Howe?has permanent atrial fibrillation and has declined rhythm control in?preference for anticoagulation and heart rate control. ?The patient reports that his average heart rate when he checks at home is 80?bpm. ?He reports some mild gradual fatigue over time but his exercise tolerance remains greater than expected for his age. A prior history of stroke,?age, hypertension and diabetes would yield a CHA2DS2-VASc score of 6.?The risks and benefits of anticoagulation therapy have been reviewed with the patient. The patient understands that anticoagulation is used to decrease thrombotic or clotting complications associated with atrial fibrillation/flutter, such as stroke and systemic embolization which can be disabling or fatal, but can, on occasion, lead to life-threatening bleeding complications including?gastrointestinal or?intracranial hemorrhage. The patient wishes to?continue?with anticoagulation. Anticoagulation options were presented to the patient which included warfarin or one of the newer direct acting oral anticoagulants and the patient wanted to?continue Pradaxa.  He was given a requisition to check his Chem-7.?I have asked Miguel Howe to?return for follow-up in approximately 6 months time.? Current Medications (including today's revisions)  ? amoxicillin (AMOXIL) 500 mg capsule Take 500 mg by mouth. Take as needed for dental appt.    ? atorvastatin (LIPITOR) 40 mg tablet Take 1 tablet by mouth once daily   ? clotrimazole-betamethasone (LOTRISONE) 1-0.05 % topical cream Apply  topically to affected area daily.   ? coenzyme Q10(+) 100 mg PO Cap Take 200 mg by mouth daily.   ? empagliflozin (JARDIANCE) 25 mg tablet Take 25 mg by mouth daily.   ? finasteride (PROSCAR) 5 mg tablet Take 5 mg by mouth daily.     ? glimepiride (AMARYL) 4 mg tablet Take 8 mg by mouth daily.   ? hydroCHLOROthiazide (HYDRODIURIL) 25 mg tablet Take 1 tablet by mouth once daily   ? losartan (COZAAR) 100 mg tablet Take 1 tablet by mouth once daily   ? metoprolol tartrate (LOPRESSOR) 50 mg tablet Take 1 tablet  by mouth twice daily   ? MULTIVITAMIN W-MINERALS/LUTEIN (CENTRUM SILVER PO) Take  by mouth daily.   ? pioglitazone (ACTOS) 30 mg tablet Take 30 mg by mouth daily.   ? PRADAXA 150 mg capsule Take 1 capsule by mouth twice daily   ? tamsulosin (FLOMAX) 0.4 mg capsule Take 0.4 mg by mouth daily.

## 2020-02-17 ENCOUNTER — Encounter: Admit: 2020-02-17 | Discharge: 2020-02-17 | Payer: MEDICARE

## 2020-02-17 DIAGNOSIS — I10 Essential (primary) hypertension: Secondary | ICD-10-CM

## 2020-02-17 DIAGNOSIS — I4892 Unspecified atrial flutter: Secondary | ICD-10-CM

## 2020-02-17 NOTE — Telephone Encounter
Left message with lab results and recommendations.  Routed copy of results to Dr. Alona Bene, PCP.  Left callback number for any questions or concerns.

## 2020-02-17 NOTE — Telephone Encounter
-----   Message from Steven B Gollub, MD sent at 02/17/2020 12:22 PM CST -----  Renal function looks stable.  His blood sugar is elevated which he usually tracks.  Please let him know and forward labs to his primary care physician.  Thanks.  SBG  ----- Message -----  From: Jayton Popelka, RN  Sent: 02/17/2020  11:02 AM CST  To: Steven B Gollub, MD    Labs for your review,  Creatinine is still elevated by improved, glucose is high.  Please let myself or the [place] RNs know if you have any recommendations.  Thank you!

## 2020-02-18 ENCOUNTER — Encounter: Admit: 2020-02-18 | Discharge: 2020-02-18 | Payer: MEDICARE

## 2020-02-18 NOTE — Telephone Encounter
-----   Message from Hester Mates, MD sent at 02/17/2020 12:22 PM CST -----  Renal function looks stable.  His blood sugar is elevated which he usually tracks.  Please let him know and forward labs to his primary care physician.  Thanks.  SBG  ----- Message -----  From: Rogelia Boga, RN  Sent: 02/17/2020  11:02 AM CST  To: Hester Mates, MD    Labs for your review,  Creatinine is still elevated by improved, glucose is high.  Please let myself or the [place] RNs know if you have any recommendations.  Thank you!

## 2020-02-18 NOTE — Telephone Encounter
Results and recommendations called to patient lmom requested call back if questions

## 2020-04-24 ENCOUNTER — Encounter: Admit: 2020-04-24 | Discharge: 2020-04-24 | Payer: MEDICARE

## 2020-04-25 MED ORDER — PRADAXA 150 MG PO CAP
ORAL_CAPSULE | Freq: Two times a day (BID) | 11 refills | Status: AC
Start: 2020-04-25 — End: ?

## 2020-06-28 ENCOUNTER — Encounter: Admit: 2020-06-28 | Discharge: 2020-06-28 | Payer: MEDICARE

## 2020-06-28 DIAGNOSIS — I4821 Permanent atrial fibrillation: Secondary | ICD-10-CM

## 2020-06-28 DIAGNOSIS — I10 Essential (primary) hypertension: Secondary | ICD-10-CM

## 2020-06-28 DIAGNOSIS — E785 Hyperlipidemia, unspecified: Secondary | ICD-10-CM

## 2020-06-28 DIAGNOSIS — I4891 Unspecified atrial fibrillation: Secondary | ICD-10-CM

## 2020-06-29 ENCOUNTER — Encounter: Admit: 2020-06-29 | Discharge: 2020-06-29 | Payer: MEDICARE

## 2020-07-01 ENCOUNTER — Ambulatory Visit: Admit: 2020-07-01 | Discharge: 2020-07-01 | Payer: MEDICARE

## 2020-07-01 ENCOUNTER — Encounter: Admit: 2020-07-01 | Discharge: 2020-07-01 | Payer: MEDICARE

## 2020-07-01 DIAGNOSIS — I4891 Unspecified atrial fibrillation: Secondary | ICD-10-CM

## 2020-07-01 DIAGNOSIS — I10 Essential (primary) hypertension: Secondary | ICD-10-CM

## 2020-07-01 DIAGNOSIS — I4821 Permanent atrial fibrillation: Secondary | ICD-10-CM

## 2020-07-01 DIAGNOSIS — E785 Hyperlipidemia, unspecified: Secondary | ICD-10-CM

## 2020-07-01 MED ORDER — REGADENOSON 0.4 MG/5 ML IV SYRG
.4 mg | Freq: Once | INTRAVENOUS | 0 refills | Status: CP
Start: 2020-07-01 — End: ?

## 2020-07-01 MED ORDER — ALBUTEROL SULFATE 90 MCG/ACTUATION IN HFAA
2 | RESPIRATORY_TRACT | 0 refills | Status: DC | PRN
Start: 2020-07-01 — End: 2020-07-06

## 2020-07-01 MED ORDER — NITROGLYCERIN 0.4 MG SL SUBL
.4 mg | SUBLINGUAL | 0 refills | Status: DC | PRN
Start: 2020-07-01 — End: 2020-07-06

## 2020-07-01 MED ORDER — AMINOPHYLLINE 500 MG/20 ML IV SOLN
50 mg | INTRAVENOUS | 0 refills | Status: AC | PRN
Start: 2020-07-01 — End: ?

## 2020-07-01 MED ORDER — SODIUM CHLORIDE 0.9 % IV SOLP
250 mL | INTRAVENOUS | 0 refills | Status: AC | PRN
Start: 2020-07-01 — End: ?

## 2020-07-01 MED ORDER — EUCALYPTUS-MENTHOL MM LOZG
1 | Freq: Once | ORAL | 0 refills | Status: AC | PRN
Start: 2020-07-01 — End: ?

## 2020-07-07 ENCOUNTER — Encounter: Admit: 2020-07-07 | Discharge: 2020-07-07 | Payer: MEDICARE

## 2020-07-07 NOTE — Telephone Encounter
-----   Message from Hester Mates, MD sent at 07/06/2020 11:01 AM CDT -----  To all:Miguel Howe's stress test is favorable.  Please let him know.  It appears that he is scheduled for his echocardiogram and scheduled to see me in August for his preoperative evaluation.  Thanks.  SBG  ----- Message -----  From: Laurence Aly, MD  Sent: 07/04/2020   6:52 AM CDT  To: Hester Mates, MD

## 2020-07-07 NOTE — Telephone Encounter
Results and recommendations called to patient.

## 2020-07-15 ENCOUNTER — Encounter: Admit: 2020-07-15 | Discharge: 2020-07-15 | Payer: MEDICARE

## 2020-07-15 ENCOUNTER — Ambulatory Visit: Admit: 2020-07-15 | Discharge: 2020-07-15 | Payer: MEDICARE

## 2020-07-15 DIAGNOSIS — I4891 Unspecified atrial fibrillation: Secondary | ICD-10-CM

## 2020-07-15 DIAGNOSIS — I10 Essential (primary) hypertension: Secondary | ICD-10-CM

## 2020-07-15 DIAGNOSIS — I4821 Permanent atrial fibrillation: Secondary | ICD-10-CM

## 2020-07-15 DIAGNOSIS — E785 Hyperlipidemia, unspecified: Secondary | ICD-10-CM

## 2020-07-15 MED ORDER — PERFLUTREN LIPID MICROSPHERES 1.1 MG/ML IV SUSP
1-20 mL | Freq: Once | INTRAVENOUS | 0 refills | Status: CP | PRN
Start: 2020-07-15 — End: ?
  Administered 2020-07-15: 19:00:00 2 mL via INTRAVENOUS

## 2020-07-15 NOTE — Telephone Encounter
-----   Message from Hester Mates, MD sent at 07/15/2020  3:05 PM CDT -----  To all: His Echo Doppler study look stable.  Please give him a preliminary report and I can review the details with him when I see him in clinic on 07/21/2020.  Thanks.  SBG  ----- Message -----  From: Mitzi Hansen, MD  Sent: 07/15/2020   2:43 PM CDT  To: Hester Mates, MD

## 2020-07-15 NOTE — Telephone Encounter
Results and recommendations called to patient.

## 2020-07-21 ENCOUNTER — Encounter: Admit: 2020-07-21 | Discharge: 2020-07-21 | Payer: MEDICARE

## 2020-07-21 DIAGNOSIS — I4891 Unspecified atrial fibrillation: Secondary | ICD-10-CM

## 2020-07-21 DIAGNOSIS — E785 Hyperlipidemia, unspecified: Secondary | ICD-10-CM

## 2020-07-21 DIAGNOSIS — I4821 Permanent atrial fibrillation: Secondary | ICD-10-CM

## 2020-07-21 DIAGNOSIS — I10 Essential (primary) hypertension: Secondary | ICD-10-CM

## 2020-07-21 DIAGNOSIS — E119 Type 2 diabetes mellitus without complications: Secondary | ICD-10-CM

## 2020-07-21 DIAGNOSIS — N400 Enlarged prostate without lower urinary tract symptoms: Secondary | ICD-10-CM

## 2020-07-21 DIAGNOSIS — R42 Dizziness and giddiness: Secondary | ICD-10-CM

## 2020-07-21 NOTE — Progress Notes
Date of Service: 07/21/2020    Miguel Howe is a 86 y.o. male.       HPI     [name] is followed for persistent atrial fibrillation/flutter.? He developed discomfort in his left knee and is getting ready for arthroscopic left knee surgery next week.  He reports no febrile or infectious symptoms recently.  He has received his first Covid vaccine.? He can walk short distances without crutches but uses crutches for longer distances such as 100 feet.  Usually his exercise tolerance exceeds that expected for his age.??He has been an avid golfer and he has also been a tournament Psychiatrist.  He still participates in local horseshoe tournaments but no longer travels to participate in regional or national tournament.  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 ?Otherwise, over the past 6?months he has been stable. He checks his blood pressure and pulse at home daily and his computer keeps a running average of his blood pressure and pulse.  His blood pressure averages 120/70 mmHg with a pulse of 80?bpm.?He has otherwise felt well and reports no angina, congestive symptoms, palpitations, sensation of sustained forceful heart pounding, lightheadedness or syncope. The patient reports no myalgias, bleeding abnormalities, or strokelike symptoms.  [name] reports no recent hospitalizations or emergency room visits over the past 6 months. He has been tolerating his medications without difficulty.   ?  Historically, [name] presented to medical attention on August 08, 2010 when he noticed unsteadiness and dizziness while playing golf. He reports that the first 9 holes of golf that he played were unremarkable, and that he became unsteady when he started playing the back 9. At the time he believes that he may have had some focal weakness in his left leg and some numbness in his left leg as well. This persisted only for 10-15 minutes and he believes that he did not mention it in the Emergency Department because he has intermittent sciatica and lower back pain and attributed the abnormality to his chronic back disorder. He stopped playing golf and went to the Emergency Department and was seen in the Emergency Department at approximately 12:45 that afternoon. He was noted to have supraventricular tachycardia with a rate of 168 beats per minute. The Emergency Department note indicated that his supraventricular tachycardia converted spontaneously with an involuntary Valsalva that occurred while an IV was being placed. [name] reported no other focal neurologic abnormalities. He reported no change in vision or difficulty with speech. 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 When I saw [name] on 08/15/10 his BP was elevated and he was started on losartan 50 mg daily. His BP was still elevated on follow-up and his losartan was increased to 100 mg daily. On 08/23/10 he reported intermittent tingling involving his left hand and he was hospitalized at [facility] for stroke evaluation. Prior to discharge on 08/26/10 he was noted to have paroxysmal atrial flutter and he was started on Pradaxa. [name] reported some transient hematuria in October 2011. He reports that he was seen by his urologist and his hematuria resolved. When I saw [name] on 03/16/11 I noticed that his BP had been lower and he reported that he felt somewhat lethargic and orthostatic  when his BP was lower. His Maxzide was stopped. Also his blood sugar was markedly elevated at 555 mg/dl. His metformin was increased and he markedly decreased his carbohydrate intake.   While [name] is unclear clinically whether he has had a stroke, a CT head scan from 08/24/10 showed MILD INVOLUTIONAL CHANGES AND SMALL SUBACUTE TO CHRONIC RIGHT BASAL GANGLIA LACUNAR INFARCT. In addition, the neurology discharge note from 08/27/10 lists subacute to chronic stroke as a discharge diagnosis. This along with age, hypertension and diabetes would yield a CHA2DS2-VASc score of 6. 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 ?When he was seen on 10/06/15 he was noted to be in atrial fibrillation. Treatment alternatives were presented to the patient and he wanted to proceed with cardioversion. Cardioversion was attempted on 10/10/15 but was unsuccessful. ?He has selected a strategy of heart rate control and anticoagulation and does not want to attempt repeat cardioversion or consider antiarrhythmic therapy or arrhythmia ablation.         Vitals:    07/21/20 1451 07/21/20 1506   BP: 104/62 118/72   BP Source: Arm, Left Upper Arm, Right Upper   Patient Position: Sitting Sitting   Pulse: 95    SpO2: 99%    Weight: 88 kg (194 lb)    Height: 1.791 m (5' 10.5)    PainSc: Zero      Body mass index is 27.44 kg/m?[name]     Past Medical History  Patient Active Problem List    Diagnosis Date Noted   ? Paroxysmal atrial flutter (HCC) 07/27/2014   ? Left arm numbness 08/23/2010   ? Arrhythmia 08/23/2010   ? HTN (hypertension) 08/23/2010   ? Dizziness 08/15/2010   ? HTN (hypertension) 08/15/2010   ? Hyperlipoproteinemia 08/15/2010   ? Atrial fibrillation (HCC) 08/15/2010   ? Diabetes (HCC) 08/15/2010   ? BPH (benign prostatic hypertrophy) 08/15/2010   ? Dyslipidemia 08/15/2010         Review of Systems   Constitution: Negative.   HENT: Positive for congestion and tinnitus.    Eyes: Negative.    Cardiovascular: Positive for dyspnea on exertion.   Respiratory: Positive for cough, sputum production and wheezing.    Endocrine: Negative.    Hematologic/Lymphatic: Bruises/bleeds easily.   Skin: Negative.    Musculoskeletal: Positive for joint pain.   Gastrointestinal: Negative.    Genitourinary: Negative.    Neurological: Positive for numbness and paresthesias.   Psychiatric/Behavioral: Negative.    Allergic/Immunologic: Negative.        Physical Exam  GENERAL: The patient is well developed, well nourished, resting comfortably and in no distress. ?  HEENT: No abnormalities of the visible oro-nasopharynx, conjunctiva or sclera are noted. ?  NECK: There is no jugular venous distension. Carotids are palpable and without bruits. There is no thyroid enlargement. ?  Chest: Lung fields are clear to auscultation. There are no wheezes or crackles. ?  CV: There is an? irregular rhythm with an apical rate of 84?BPM. Variable intensity of S1. There are no murmurs, gallops or rubs. ?  ABD: The abdomen is soft and supple with normal bowel sounds. There is no hepatosplenomegaly, ascites, tenderness, masses or bruits. Ventral hernia noted.  Neuro: There are no focal motor defects. Ambulation is normal. Cognitive function appears normal. ?  Ext:?There is trace bipedal edema without evidence of deep vein thrombosis. Peripheral pulses are satisfactory. ?  SKIN:?There are no rashes and  no cellulitis ?  PSYCH:?The patient is calm, rationale and oriented.    Cardiovascular Studies  A twelve-lead ECG was obtained on 07/21/2020 reveals atrial fibrillation with a heart rate of 82 bpm.  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    Echo Doppler 07/15/2020:  Left ventricular systolic function is within normal limits.  LVEF 65%  Normal right ventricular systolic function.   Mild mitral regurgitation.   Aortic valve sclerosis.   No pericardial effusion.   Estimated peak systolic pulmonary artery pressure is 32 mmHg.     Regadenoson thallium stress test 07/01/2020:  Scintigraphic (planar/tomographic)::  Summed Stress Score:  0   , Summed Rest Score:  0. Post stress planar projection images show normal lung uptake no transit dilatation.  Planar views are unremarkable.  Tomography shows homogeneous uptake of tracer.  Quantitative polar maps are statistically normal. Regional Wall Thickening and Motion Post Stress: No definite thickening abnormality.  In the presence of a variable RR interval regional motion abnormalities are somewhat problematic. Left Ventricular Ejection Fraction (post stress, in the resting state) =? 68 %.  Left Ventricular End Diastolic Volume: 59 mL  SUMMARY/OPINION:?? This study is normal.  No definite perfusion defects are present.  Global left ventricular function is within normal limits.  High risk indicators are not noted.  The patient is in atrial fibrillation with right bundle branch block and left anterior fascicular block with moderate ventricular response throughout the study. In aggregate the current study is low risk in regards to predicted annual cardiovascular mortality rate.    Problems Addressed Today  Encounter Diagnoses   Name Primary?   ? Essential hypertension    ? Hyperlipoproteinemia    ? Permanent atrial fibrillation (HCC)    ? Dyslipidemia    ? Atrial fibrillation, unspecified type (HCC)        Assessment and Plan     Based upon the results of the patient's clinical profile and non-invasive cardiovascular testing, the risk for cardiovascular morbidity and mortality associated with non-cardiac surgery is INTERMEDIATE. This by no means precludes surgery but provides information for the surgeon, anesthesiologist and patient concerning the risks of surgery.  A decision to proceed with surgery should be made based upon the relative benefits and risks involved. Indeed, orthopedic surgery could be a very helpful intervention for this patient's overall health maintenance and could prove very helpful for improvement in his exercise tolerance and cardiovascular stability. There are no absolute contra-indications to elective surgery, as deemed necessary. No additional cardiovascular testing is required at this time.  Mr. Melnyk understands the benefits and risks associated with anticoagulation with Pradaxa.  He indicated that he was willing to stop his Pradaxa for a short interval if required by his surgical team.  Therefore, if his Pradaxa is held in the perioperative period, I would recommend holding it for as short of a period as possible, restarting his Pradaxaas soon as his bleeding risk is not prohibitive.  He was given a requisition to obtain a Chem-7 I have asked him to return for follow-up in 6 months time to follow his progress. The total time spent during this interview and exam was 45 minutes.         Current Medications (including today's revisions)  ? acetaminophen (TYLENOL EXTRA STRENGTH) 500 mg tablet Take 500 mg by mouth as Needed for Pain. Max of 4,000 mg of acetaminophen in 24 hours.   ? amoxicillin (AMOXIL) 500 mg capsule Take 500 mg by  mouth. Take as needed for dental appt.    ? atorvastatin (LIPITOR) 40 mg tablet Take 1 tablet by mouth once daily   ? clotrimazole-betamethasone (LOTRISONE) 1-0.05 % topical cream Apply  topically to affected area daily.   ? coenzyme Q10(+) 100 mg PO Cap Take 200 mg by mouth daily.   ? empagliflozin (JARDIANCE) 25 mg tablet Take 25 mg by mouth daily.   ? finasteride (PROSCAR) 5 mg tablet Take 5 mg by mouth daily.     ? glimepiride (AMARYL) 4 mg tablet Take 8 mg by mouth daily.   ? hydroCHLOROthiazide (HYDRODIURIL) 25 mg tablet Take 1 tablet by mouth once daily   ? losartan (COZAAR) 100 mg tablet Take 1 tablet by mouth once daily   ? metoprolol tartrate (LOPRESSOR) 50 mg tablet Take 1 tablet by mouth twice daily   ? MULTIVITAMIN W-MINERALS/LUTEIN (CENTRUM SILVER PO) Take  by mouth daily.   ? pioglitazone (ACTOS) 30 mg tablet Take 30 mg by mouth daily.   ? PRADAXA 150 mg capsule Take 1 capsule by mouth twice daily   ? tamsulosin (FLOMAX) 0.4 mg capsule Take 0.4 mg by mouth daily.

## 2020-08-01 ENCOUNTER — Encounter: Admit: 2020-08-01 | Discharge: 2020-08-01 | Payer: MEDICARE

## 2020-08-01 DIAGNOSIS — I4821 Permanent atrial fibrillation: Secondary | ICD-10-CM

## 2020-08-01 DIAGNOSIS — E785 Hyperlipidemia, unspecified: Secondary | ICD-10-CM

## 2020-08-01 DIAGNOSIS — I10 Essential (primary) hypertension: Secondary | ICD-10-CM

## 2020-08-01 DIAGNOSIS — I4891 Unspecified atrial fibrillation: Secondary | ICD-10-CM

## 2020-08-01 LAB — BASIC METABOLIC PANEL
Lab: 19
Lab: 4
Lab: 1.2
Lab: 106
Lab: 116 — ABNORMAL HIGH (ref 70–105)
Lab: 141
Lab: 26
Lab: 9.1

## 2020-08-03 ENCOUNTER — Encounter: Admit: 2020-08-03 | Discharge: 2020-08-03 | Payer: MEDICARE

## 2020-08-03 NOTE — Telephone Encounter
-----   Message from Hester Mates, MD sent at 08/03/2020  1:28 PM CDT -----  To all: Improved serum creatinine and serum glucose.  Please let him know.  Thanks.  SBG  ----- Message -----  From: Lauralee Evener, RN  Sent: 08/01/2020  11:28 AM CDT  To: Hester Mates, MD    Lab results for your review and recommendations. Patient scheduled for knee replacement surgery.

## 2020-08-03 NOTE — Telephone Encounter
Results and recommendations called to patient.

## 2020-08-20 ENCOUNTER — Encounter: Admit: 2020-08-20 | Discharge: 2020-08-20 | Payer: MEDICARE

## 2020-08-20 MED ORDER — METOPROLOL TARTRATE 50 MG PO TAB
ORAL_TABLET | Freq: Two times a day (BID) | 0 refills
Start: 2020-08-20 — End: ?

## 2021-01-30 ENCOUNTER — Encounter

## 2021-01-30 DIAGNOSIS — E785 Hyperlipidemia, unspecified: Secondary | ICD-10-CM

## 2021-01-30 DIAGNOSIS — I10 Essential (primary) hypertension: Secondary | ICD-10-CM

## 2021-01-30 MED ORDER — ATORVASTATIN 40 MG PO TAB
40 mg | ORAL_TABLET | Freq: Every day | ORAL | 3 refills | Status: AC
Start: 2021-01-30 — End: ?

## 2021-02-01 MED ORDER — ATORVASTATIN 40 MG PO TAB
ORAL_TABLET | Freq: Every day | 0 refills
Start: 2021-02-01 — End: ?

## 2021-03-28 ENCOUNTER — Encounter: Admit: 2021-03-28 | Discharge: 2021-03-28 | Payer: MEDICARE

## 2021-03-28 MED ORDER — PRADAXA 150 MG PO CAP
ORAL_CAPSULE | Freq: Two times a day (BID) | 11 refills | Status: AC
Start: 2021-03-28 — End: ?

## 2021-04-20 ENCOUNTER — Encounter: Admit: 2021-04-20 | Discharge: 2021-04-20 | Payer: MEDICARE

## 2021-04-20 NOTE — Progress Notes
Attempted to contact patient by phone on 04/20/2021 regarding labs ordered per Dr. Dedra Skeens for Lipid Panel and ALT that have not been completed. No answer on patient's phone. Left message on patient's voicemail to return call to the clinic. Faxed lab orders to patient's previous preferred lab at [facility] (FAX: [phone]).

## 2021-04-25 ENCOUNTER — Encounter: Admit: 2021-04-25 | Discharge: 2021-04-25 | Payer: MEDICARE

## 2021-04-25 DIAGNOSIS — E785 Hyperlipidemia, unspecified: Secondary | ICD-10-CM

## 2021-04-25 DIAGNOSIS — R42 Dizziness and giddiness: Secondary | ICD-10-CM

## 2021-04-25 DIAGNOSIS — I10 Essential (primary) hypertension: Secondary | ICD-10-CM

## 2021-04-25 DIAGNOSIS — N400 Enlarged prostate without lower urinary tract symptoms: Secondary | ICD-10-CM

## 2021-04-25 DIAGNOSIS — E119 Type 2 diabetes mellitus without complications: Secondary | ICD-10-CM

## 2021-04-25 DIAGNOSIS — I4891 Unspecified atrial fibrillation: Secondary | ICD-10-CM

## 2021-04-25 MED ORDER — METOPROLOL TARTRATE 75 MG PO TAB
75 mg | ORAL_TABLET | Freq: Two times a day (BID) | ORAL | 3 refills | 90.00000 days | Status: AC
Start: 2021-04-25 — End: ?

## 2021-04-25 NOTE — Progress Notes
Date of Service: 04/25/2021    Miguel Howe is a 87 y.o. male.       HPI    Miguel Howe is followed for persistent atrial fibrillation/flutter.? He underwent arthroscopic left knee surgery in August 2021 with excellent results..  He reports no febrile or infectious symptoms recently and he has received his COVID vaccines.  His exercise tolerance is improved considerably following left knee surgery and he was playing 18-holes of golf the day.  He plays golf twice a week when the weather is nice. ?He?has been an avid golfer and he has also been a tournament horseshoe player.  He still participates in local horseshoe tournaments but no longer travels to participate in regional or national tournament.  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 ?Otherwise, over the past 6?months he has been stable. He checks his blood pressure and pulse at home daily and his computer keeps a running average of his?blood pressure?and pulse. ?His blood pressure averages 120/70 mmHg?with a pulse of 80-90 bpm.?He has otherwise felt well and reports no angina, congestive symptoms, palpitations, sensation of sustained forceful heart pounding, lightheadedness or syncope. The patient reports no myalgias, bleeding abnormalities,?or strokelike symptoms.??Miguel Howe reports no recent hospitalizations or emergency room visits over the past 6 months. He has been tolerating his medications without difficulty.   ?  Historically, Miguel Howe presented to medical attention on August 08, 2010 when he noticed unsteadiness and dizziness while playing golf. He reports that the first 9 holes of golf that he played were unremarkable, and that he became unsteady when he started playing the back 9. At the time he believes that he may have had some focal weakness in his left leg and some numbness in his left leg as well. This persisted only for 10-15 minutes and he believes that he did not mention it in the Emergency Department because he has intermittent sciatica and lower back pain and attributed the abnormality to his chronic back disorder. He stopped playing golf and went to the Emergency Department and was seen in the Emergency Department at approximately 12:45 that afternoon. He was noted to have supraventricular tachycardia with a rate of 168 beats per minute. The Emergency Department note indicated that his supraventricular tachycardia converted spontaneously with an involuntary Valsalva that occurred while an IV was being placed. [name] reported no other focal neurologic abnormalities. He reported no change in vision or difficulty with speech. 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 When I saw Miguel Howe on 08/15/10 his BP was elevated and he was started on losartan 50 mg daily. His BP was still elevated on follow-up and his losartan was increased to 100 mg daily. On 08/23/10 he reported intermittent tingling involving his left hand and he was hospitalized at [facility] for stroke evaluation. Prior to discharge on 08/26/10 he was noted to have paroxysmal atrial flutter and he was started on Pradaxa. Mr. Segalla reported some transient hematuria in October 2011. He reports that he was seen by his urologist and his hematuria resolved. When I saw Miguel Howe on 03/16/11 I noticed that his BP had been lower and he reported that he felt somewhat lethargic and orthostatic when his BP was lower. His Maxzide was stopped. Also  his blood sugar was markedly elevated at 555 mg/dl. His metformin was increased and he markedly decreased his carbohydrate intake.   While Miguel Howe is unclear clinically whether he has had a stroke, a CT head scan from 08/24/10 showed MILD INVOLUTIONAL CHANGES AND SMALL SUBACUTE TO CHRONIC RIGHT BASAL GANGLIA LACUNAR INFARCT. In addition, the neurology discharge note from 08/27/10 lists subacute to chronic stroke as a discharge diagnosis. This along with age, hypertension and diabetes would yield a CHA2DS2-VASc score of 6. 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 ?When he was seen on 10/06/15 he was noted to be in atrial fibrillation. Treatment alternatives were presented to the patient and he wanted to proceed with cardioversion. Cardioversion was attempted on 10/10/15 but was unsuccessful. ?He has selected a strategy of heart rate control and anticoagulation and does not want to attempt repeat cardioversion or consider antiarrhythmic therapy or arrhythmia ablation.       Vitals:    04/25/21 1436   BP: 122/66   BP Source: Arm, Left Upper   Pulse: 85   SpO2: 97%   O2 Device: None (Room air)   PainSc: Zero   Weight: 85.3 kg (188 lb)   Height: 179.1 cm (5' 10.5)     Body mass index is 26.59 kg/m?[name]     Past Medical History  Patient Active Problem List    Diagnosis Date Noted   ? Paroxysmal atrial flutter (HCC) 07/27/2014   ? Left arm numbness 08/23/2010   ? Arrhythmia 08/23/2010   ? HTN (hypertension) 08/23/2010   ? Dizziness 08/15/2010   ? HTN (hypertension) 08/15/2010   ? Hyperlipoproteinemia 08/15/2010   ? Atrial fibrillation (HCC) 08/15/2010   ? Diabetes (HCC) 08/15/2010   ? BPH (benign prostatic hypertrophy) 08/15/2010   ? Dyslipidemia 08/15/2010         Review of Systems   Constitutional: Negative.   HENT: Negative.    Eyes: Negative.    Cardiovascular: Negative.    Respiratory: Negative.    Endocrine: Negative.    Hematologic/Lymphatic: Negative.    Skin: Negative.    Musculoskeletal: Negative.    Gastrointestinal: Negative.    Genitourinary: Negative.    Neurological: Negative.    Psychiatric/Behavioral: Negative.    Allergic/Immunologic: Negative.        Physical Exam  GENERAL: The patient is well developed, well nourished, resting comfortably and in no distress. ?  HEENT: No abnormalities of the visible oro-nasopharynx, conjunctiva or sclera are noted. ?  NECK: There is no jugular venous distension. Carotids are palpable and without bruits. There is no thyroid enlargement. ?  Chest: Lung fields are clear to auscultation. There are no wheezes or crackles. ?  CV: There is an? irregular rhythm with an apical rate of 88?BPM. Variable intensity of S1. There are no murmurs, gallops or rubs. ?  ABD: The abdomen is soft and supple with normal bowel sounds. There is no hepatosplenomegaly, ascites, tenderness, masses or bruits. Ventral hernia noted.  Neuro: There are no focal motor defects. Ambulation is normal. Cognitive function appears normal. ?  Ext:?There is trace bipedal edema without evidence of deep vein thrombosis. Peripheral pulses are satisfactory. ?  SKIN:?There are no rashes and no cellulitis ?  PSYCH:?The patient is calm, rationale and oriented    Cardiovascular Studies  A twelve-lead ECG was obtained on 04/25/2021 reveals atrial fibrillation with a heart rate of 103 bpm.  Right bundle branch block is  noted along with ventricular ectopic beats.  Echo Doppler 07/15/2020:  Left ventricular systolic function is within normal limits.  LVEF 65%  Normal right ventricular systolic function.   Mild mitral regurgitation.   Aortic valve sclerosis.   No pericardial effusion.   Estimated peak systolic pulmonary artery pressure is 32 mmHg.   ?  Regadenoson thallium stress test 07/01/2020:  Scintigraphic (planar/tomographic)::  Summed Stress Score: ?0 ??, Summed Rest Score: ?0. Post stress planar projection images show normal lung uptake no transit dilatation. ?Planar views are unremarkable. ?Tomography shows homogeneous uptake of tracer. ?Quantitative polar maps are statistically normal. Regional Wall Thickening and Motion Post Stress: No definite thickening abnormality. ?In the presence of a variable RR interval regional motion abnormalities are somewhat problematic. Left Ventricular Ejection Fraction (post stress, in the resting state) =??68 %.  Left Ventricular End Diastolic Volume: 59 mL  SUMMARY/OPINION:???This study is normal. ?No definite perfusion defects are present. ?Global left ventricular function is within normal limits. ?High risk indicators are not noted. ?The patient is in atrial fibrillation with right bundle branch block and left anterior fascicular block with moderate ventricular response throughout the study. In aggregate the current study is low risk in regards to predicted annual cardiovascular mortality rate.  Cardiovascular Health Factors  Vitals BP Readings from Last 3 Encounters:   04/25/21 122/66   07/21/20 118/72   07/15/20 114/74     Wt Readings from Last 3 Encounters:   04/25/21 85.3 kg (188 lb)   07/21/20 88 kg (194 lb)   07/15/20 91.6 kg (202 lb)     BMI Readings from Last 3 Encounters:   04/25/21 26.59 kg/m?   07/21/20 27.44 kg/m?   07/15/20 28.57 kg/m?      Smoking Social History     Tobacco Use   Smoking Status Never Smoker   Smokeless Tobacco Never Used      Lipid Profile Cholesterol   Date Value Ref Range Status   01/28/2019 125 (L) 150 - 200 Final     HDL   Date Value Ref Range Status   01/28/2019 39  Final     LDL   Date Value Ref Range Status   01/28/2019 66  Final     Triglycerides   Date Value Ref Range Status   01/28/2019 126  Final      Blood Sugar Hemoglobin A1C   Date Value Ref Range Status   08/29/2018 7.7 (H) 4.5 - 6.5 Final     Glucose   Date Value Ref Range Status   08/01/2020 116 (H) 70 - 105 Final   02/16/2020 256 (H) 70 - 105 Final   08/04/2019 183 (H) 70 - 105 Final          Problems Addressed Today  Encounter Diagnoses   Name Primary?   ? Primary hypertension Yes   ? Dyslipidemia        Assessment and Plan   Mr. Berkley wanted to keep his average heart rate closer to 80 bpm and wanted to increase his metoprolol from 50 mg twice daily to 75 mg twice daily.  I asked him to contact me if he developed any worrisome symptoms associated with this increase in his metoprolol.  Otherwise he reports no angina or congestive symptoms and his blood pressure appears well controlled.  His CHADS2 Vasc score is 4 for age (2 points), diabetes and hypertension.  The risks and benefits of anticoagulation therapy have been reviewed with the patient. The patient understands that anticoagulation  is used to decrease thrombotic or clotting complications associated with atrial fibrillation/flutter, such as stroke and systemic embolization which can be disabling or fatal, but can, on occasion, lead to life-threatening bleeding complications including gastrointestinal or intracranial hemorrhage. The patient wishes to contimnue with anticoagulation. Anticoagulation options were presented to the patient and the patient wanted to continue dabigatran.  He was given a requisition to obtain a fasting lipid profile, ALT, and Chem-7.  I have asked him to return for follow-up in 6 months time.         Current Medications (including today's revisions)  ? acetaminophen (TYLENOL EXTRA STRENGTH) 500 mg tablet Take 500 mg by mouth as Needed for Pain. Max of 4,000 mg of acetaminophen in 24 hours.   ? amoxicillin (AMOXIL) 500 mg capsule Take 500 mg by mouth. Take as needed for dental appt.    ? atorvastatin (LIPITOR) 40 mg tablet Take one tablet by mouth daily.   ? clotrimazole-betamethasone (LOTRISONE) 1-0.05 % topical cream Apply  topically to affected area daily.   ? coenzyme Q10(+) 100 mg PO Cap Take 200 mg by mouth daily.   ? empagliflozin (JARDIANCE) 25 mg tablet Take 25 mg by mouth daily.   ? finasteride (PROSCAR) 5 mg tablet Take 5 mg by mouth daily.     ? glimepiride (AMARYL) 4 mg tablet Take 8 mg by mouth daily.   ? hydroCHLOROthiazide (HYDRODIURIL) 25 mg tablet Take 1 tablet by mouth once daily   ? losartan (COZAAR) 100 mg tablet Take 1 tablet by mouth once daily   ? metoprolol tartrate (LOPRESSOR) 50 mg tablet Take 1 tablet by mouth twice daily   ? MULTIVITAMIN W-MINERALS/LUTEIN (CENTRUM SILVER PO) Take  by mouth daily.   ? pioglitazone (ACTOS) 30 mg tablet Take 30 mg by mouth daily.   ? PRADAXA 150 mg capsule Take 1 capsule by mouth twice daily   ? tamsulosin (FLOMAX) 0.4 mg capsule Take 0.4 mg by mouth daily.

## 2021-04-26 ENCOUNTER — Encounter: Admit: 2021-04-26 | Discharge: 2021-04-26 | Payer: MEDICARE

## 2021-04-26 DIAGNOSIS — I10 Essential (primary) hypertension: Secondary | ICD-10-CM

## 2021-04-26 DIAGNOSIS — E785 Hyperlipidemia, unspecified: Secondary | ICD-10-CM

## 2021-04-26 LAB — LIPID PROFILE
CHOLESTEROL/HDL %: 3 mL/min (ref >60)
CHOLESTEROL: 123 g/dL (ref 3.5–5.0)
HDL: 43 U/L — ABNORMAL HIGH (ref 7–40)
LDL: 63 MMOL/L — ABNORMAL HIGH (ref 8.4–25.7)
VLDL: 17 U/L — ABNORMAL HIGH (ref 0.72–1.25)

## 2021-04-26 LAB — ALT (SGPT): ALT: 21 U/L (ref 25–110)

## 2021-04-26 LAB — BASIC METABOLIC PANEL: SODIUM: 142 mg/dL (ref 0.3–1.2)

## 2021-04-26 NOTE — Telephone Encounter
-----   Message from Hester Mates, MD sent at 04/26/2021  1:57 PM CDT -----  Stable serum creatinine.  Please let him know.  Thanks.  SBG  ----- Message -----  From: Lauralee Evener, RN  Sent: 04/26/2021   1:29 PM CDT  To: Hester Mates, MD    Lab results following office visit for your review and recommendations. Patient's Creat is 1.36 but seems to be his baseline.

## 2021-04-26 NOTE — Progress Notes
The following is the approval for metoprolol:

## 2021-04-26 NOTE — Telephone Encounter
Left vm with lab results and call back number for further questions or concerns.

## 2021-06-29 IMAGING — MR Knee^Routine
5 series · 35 of 40 positions shown · non-contrast
Comparison: none

[Series 3: T2 fat-sat · axial · 4.0mm · 0.50mm/px · z∈[+23,+40]mm · 2 of 2 slices shown (1 of 3)]
[im 1/2]
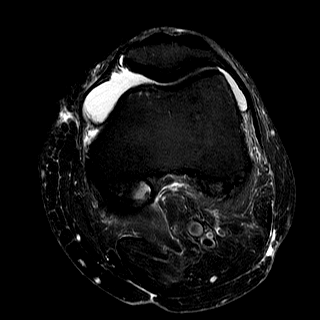
[im 2/2]
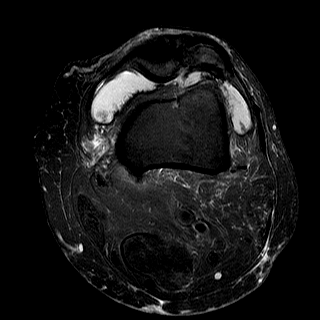

[Series 4: T1 · axial · 4.0mm · 0.62mm/px · z∈[-17,+40]mm · 3 of 3 slices shown]
[im 1/3]
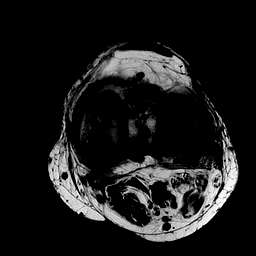
[im 2/3]
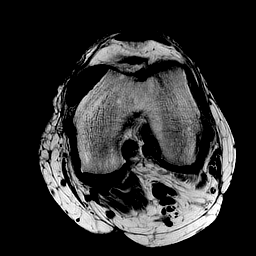
[im 3/3]
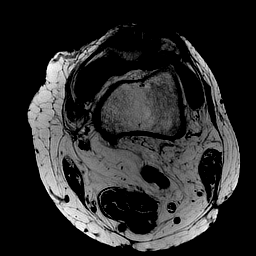

[Series 5: T2 fat-sat · coronal · 3.5mm · 0.50mm/px · 10 of 12 slices shown (2 of 3)]
[im 1/12]
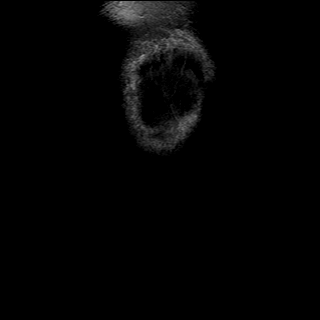
[im 2/12]
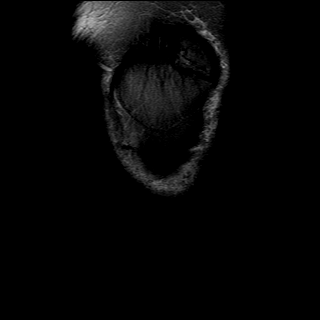
[im 3/12]
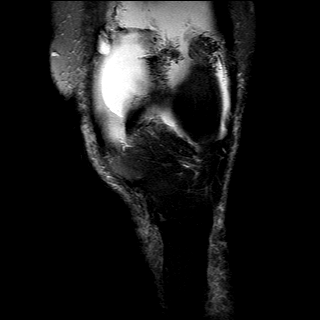
[im 4/12]
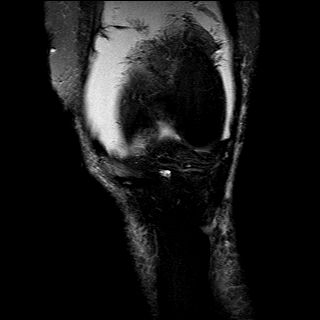
[im 5/12]
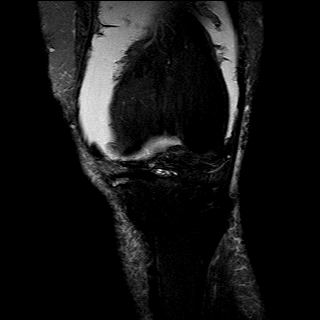
[im 7/12]
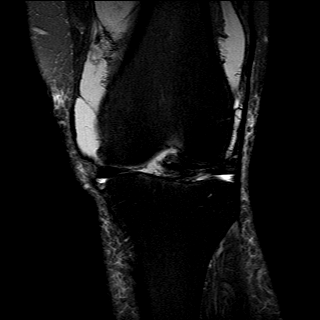
[im 8/12]
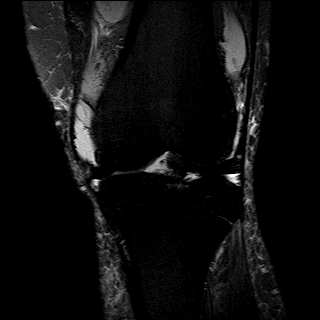
[im 9/12]
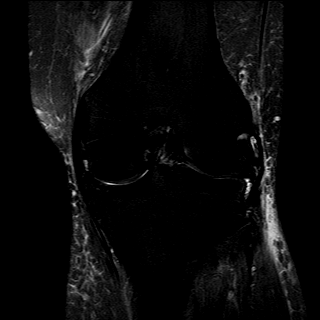
[im 10/12]
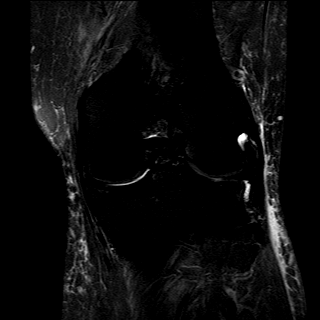
[im 12/12]
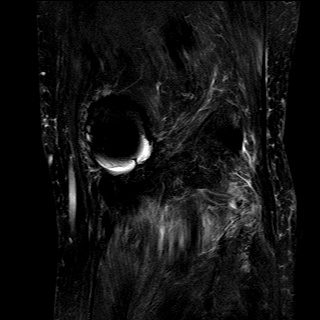

[Series 6: T2 fat-sat · sagittal · 4.0mm · 0.50mm/px · 9 of 17 slices shown (3 of 3)]
[im 1/17]
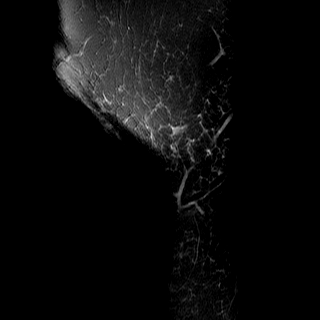
[im 2/17]
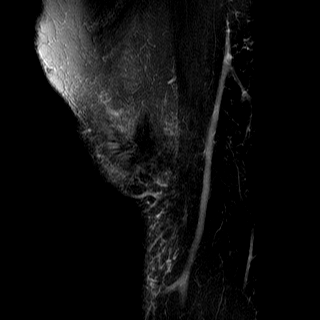
[im 3/17]
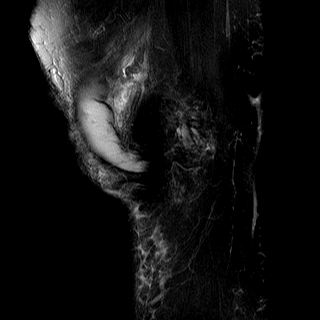
[im 5/17]
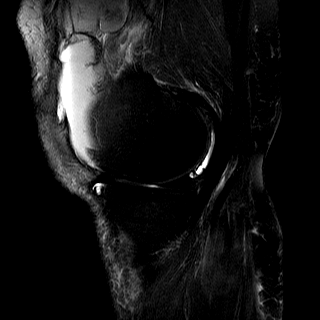
[im 8/17]
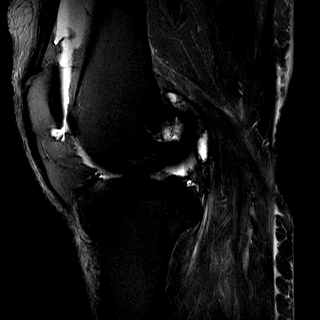
[im 9/17]
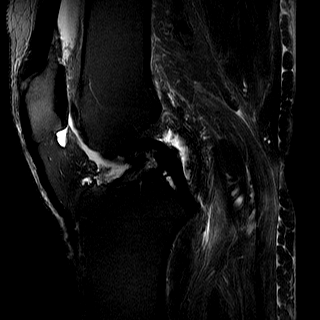
[im 12/17]
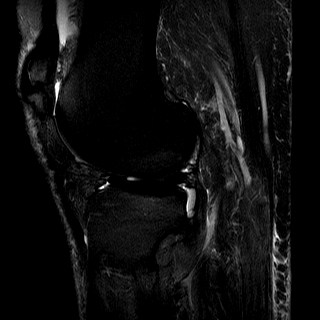
[im 14/17]
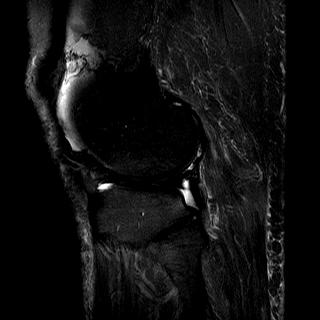
[im 17/17]
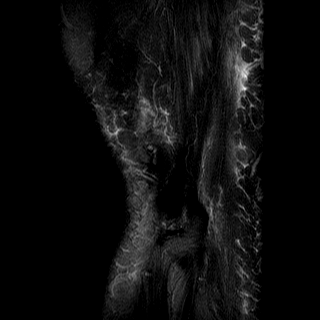

[Series 7: PD · sagittal · 4.0mm · 0.29mm/px · 11 of 13 slices shown]
[im 1/13]
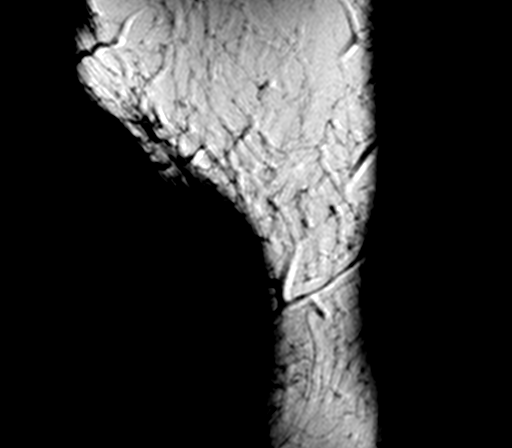
[im 2/13]
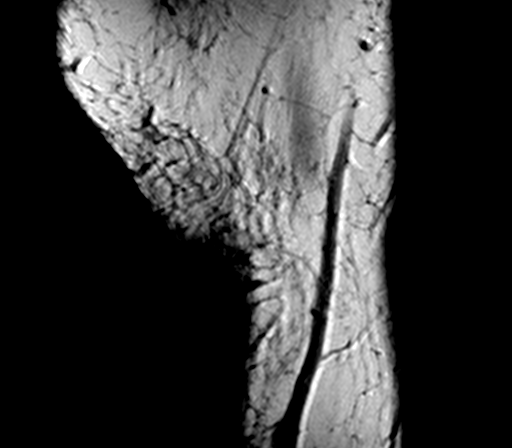
[im 3/13]
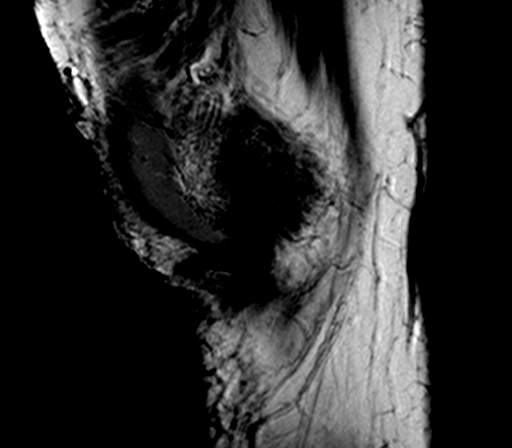
[im 4/13]
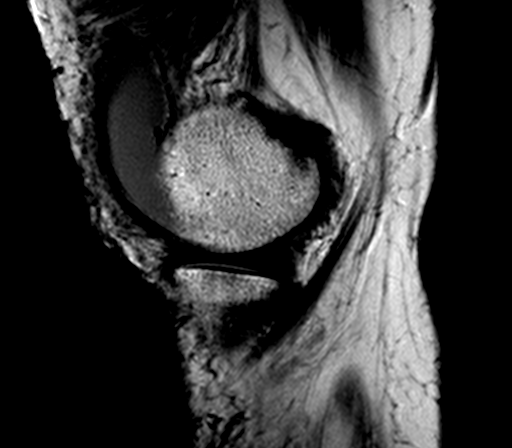
[im 5/13]
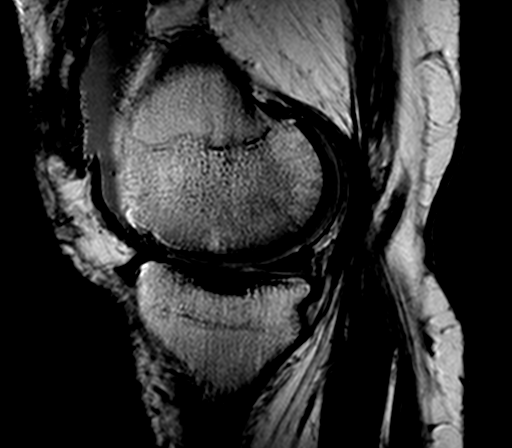
[im 7/13]
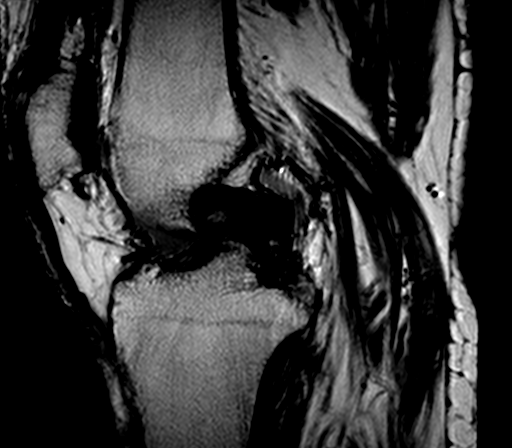
[im 8/13]
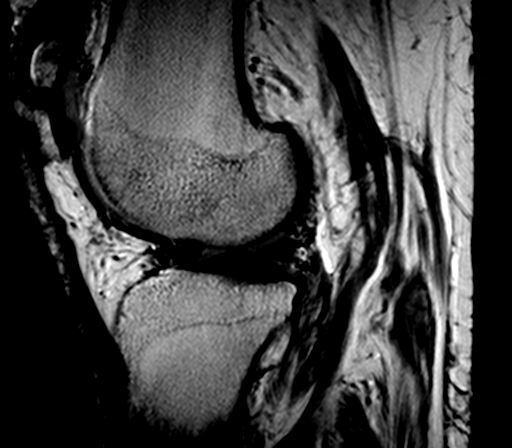
[im 9/13]
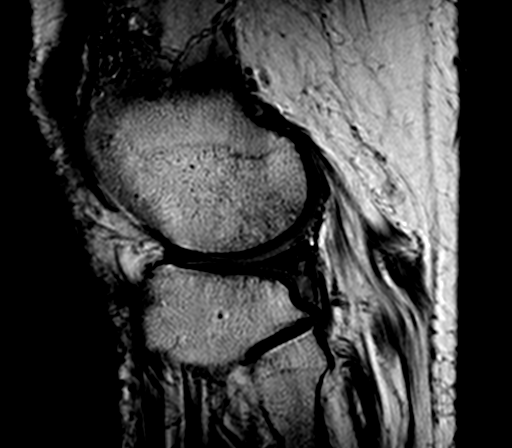
[im 10/13]
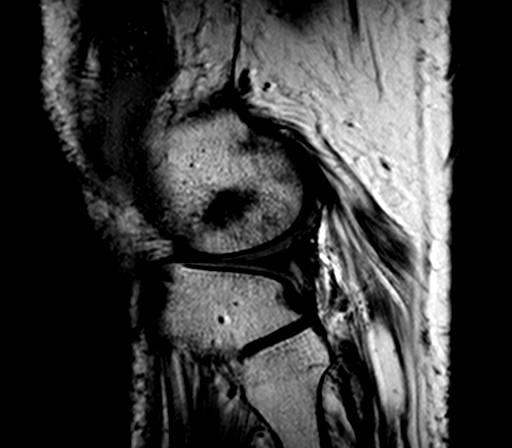
[im 11/13]
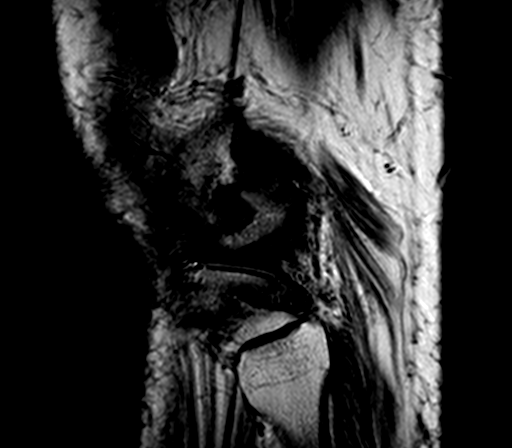
[im 13/13]
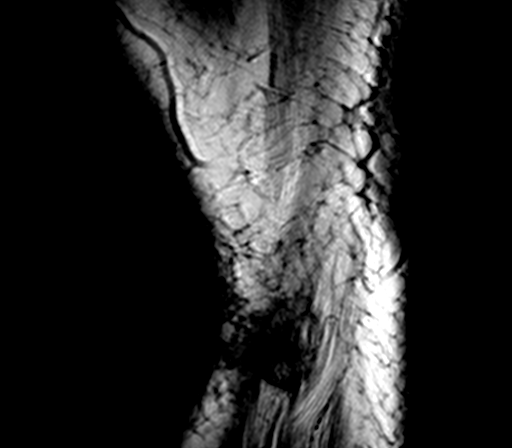

[35 of 40 positions shown; findings below may reference images not displayed]

DIAGNOSTIC STUDIES

EXAM

MRI left knee without contrast.

INDICATION

left knee pain
CHRONIC WORSENING LEFT KNEE PAIN.  RG

TECHNIQUE

Sagittal, axial, and coronal images were obtained with variable T1 and T2 weighting.

COMPARISONS

None available

FINDINGS

There is irregular osteophytic spurring off the superior patella. Quadriceps and patellar tendons
demonstrate normal signal intensity. Moderate chondromalacia patella is noted. Moderate knee effus
ion is noted with suprapatellar plica. Irregularity of Hoffa's fat pad may indicate synovitis.

The anterior and posterior cruciate ligaments are normal.

The medial and lateral collateral ligaments are normal.

Lateral meniscus is within normal limits.

The medial meniscus is also within normal limits without evidence for tear.

There is mild subchondral edema within the medial femoral condyle consistent with mild degenerative
change and loss of articular cartilage.

IMPRESSION

Moderate chondromalacia patella with moderate knee effusion and suprapatellar plica.

Mild irregularity of Hoffa's fat pad may indicate synovitis.

Osteoarthritic changes of the medial compartment of the knee and spurring off the superior patella.

Tech Notes:

CHRONIC WORSENING LEFT KNEE PAIN.  RG

## 2021-07-03 ENCOUNTER — Encounter: Admit: 2021-07-03 | Discharge: 2021-07-03 | Payer: MEDICARE

## 2021-07-03 NOTE — Telephone Encounter
Received a fax from [facility] stating that patient is scheduled to have a left knee arthroscopy with plica resection on July 28, 2021 with Dr. Para March. They are wanting cardiac risk stratification from Dr. Arna Medici and guidance on holding Pradaxa prior to the procedure.     Called and spoke with patient. He denies any new cardiac symptoms including palpitations, chest pain, shortness of breath, syncope, or decreased activity tolerance.     Patient last saw Dr. Arna Medici 04/25/21  Last Echo: 07/16/20- LV systolic function within normal limits. EF 65%. Mild mitral regurgitation. Aortic valve sclerosis. No pericardial effusion. Estimated peak systolic pulmonary artery pressure is 32 mmHg.   Last Stress test: 07/01/20- SUMMARY/OPINION: This study is normal.  No definite perfusion defects are present.  Global left ventricular function is within normal limits.  High risk indicators are not noted.  The patient is in atrial fibrillation with right bundle branch block and left anterior fascicular block with moderate ventricular response throughout the study.        Anticoagulation:  Pradaxa for atrial fibrillation. Patient is a CHA2DS2-VASc score of 4 for age, hypertension, and diabetes.

## 2021-07-04 ENCOUNTER — Encounter: Admit: 2021-07-04 | Discharge: 2021-07-04 | Payer: MEDICARE

## 2021-07-04 NOTE — Telephone Encounter
Miguel Mates, MD  Florene Route, RN; Weston Brass  Caller: Unspecified Miguel Howe, 4:32 PM)  Adelina Mings: I have placed an addendum on his clinic visit from 04/25/2021 addressing his perioperative status and Pradaxa. Please talk to Miguel Howe and make sure that he understands the stroke risk involved with stopping his Pradaxa and make sure that it is acceptable to him and please document. Bridging anticoagulation is not required although I recommend minimizing the amount of time that he is not receiving his Pradaxa for anticoagulation. Thanks. SBG            Called and left message on phone.

## 2021-07-27 ENCOUNTER — Encounter: Admit: 2021-07-27 | Discharge: 2021-07-27 | Payer: MEDICARE

## 2021-08-25 IMAGING — CT ABDOMEN_PELVIS WO(Adult)
2 of 3 series · 12 of 46 positions shown, 14 images · non-contrast
Comparison: No relevant prior studies available.

DIAGNOSTIC STUDIES

EXAM:  CT ABDOMEN AND PELVIS WITHOUT INTRAVENOUS CONTRAST  ([id number])
INDICATION: pain vomiting fever n/v that started this afternoon, fever, shaking, no hx of cancer,
no abd pain.ct/nm 0/0
TECHNIQUE: Axial computed tomography images of the abdomen and pelvis without intravenous
contrast.  Sagittal and coronal reformatted images were created and reviewed.

[Series 2: abdomen_pelvis ax 3.00 br40 s3 · axial · 0.62mm/px · z∈[+1432,+1819]mm · 9 of 139 slices shown, 11 images]
[im 9/139  soft-tissue]
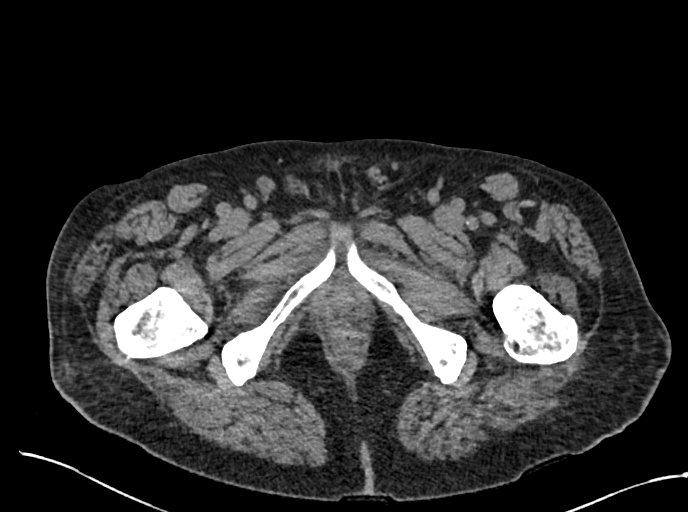
[im 9/139  bone]
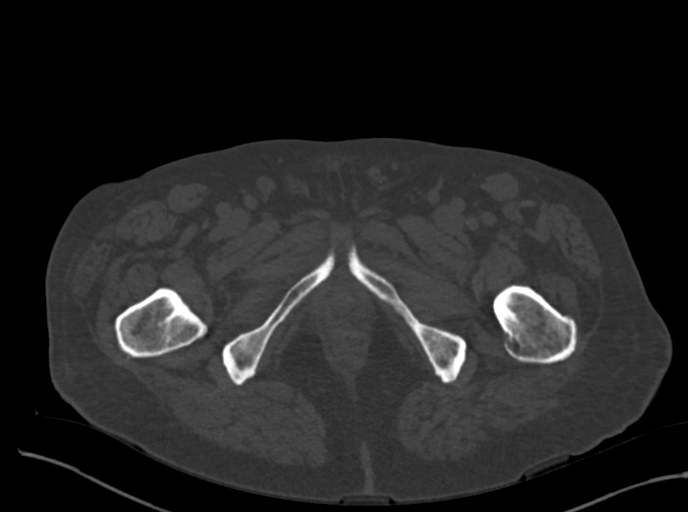
[im 27/139  soft-tissue]
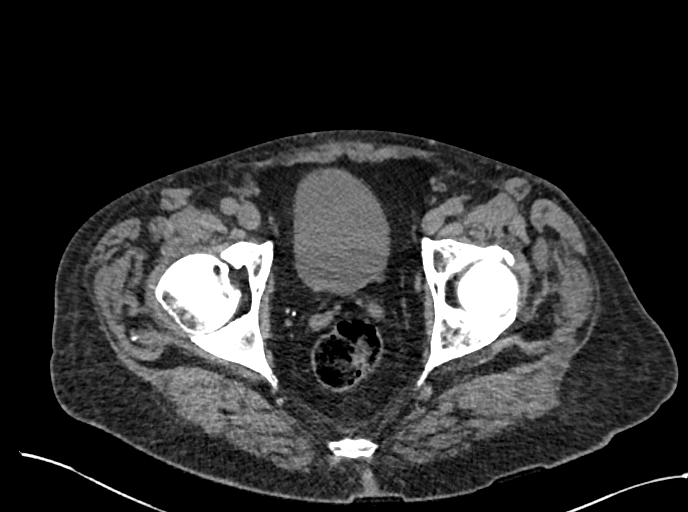
[im 41/139  soft-tissue]
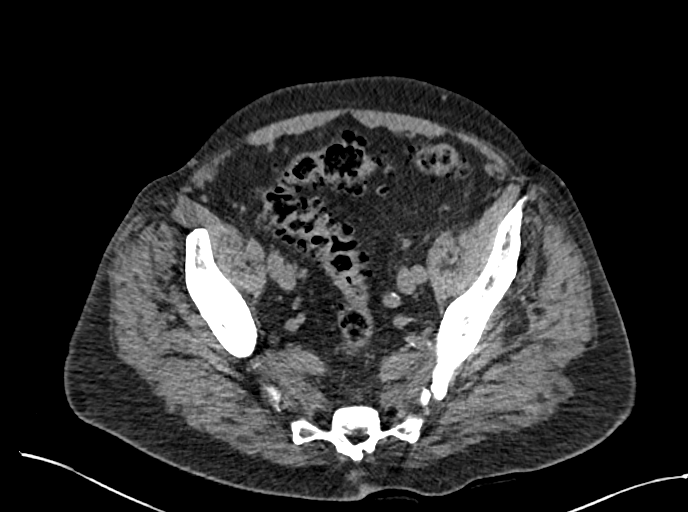
[im 54/139  soft-tissue]
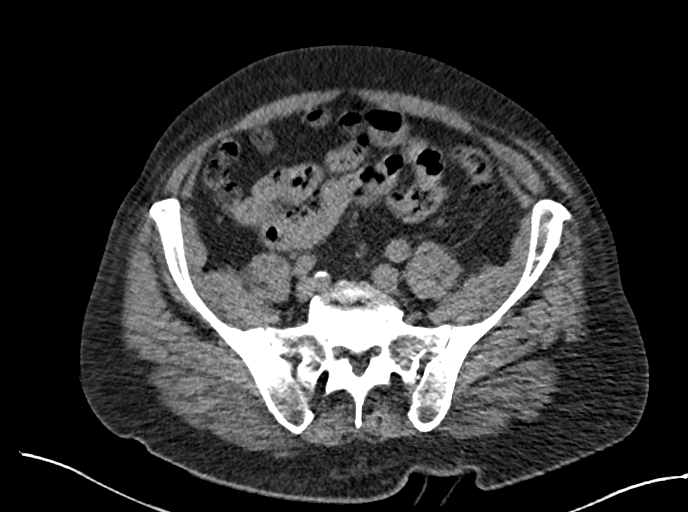
[im 72/139  soft-tissue]
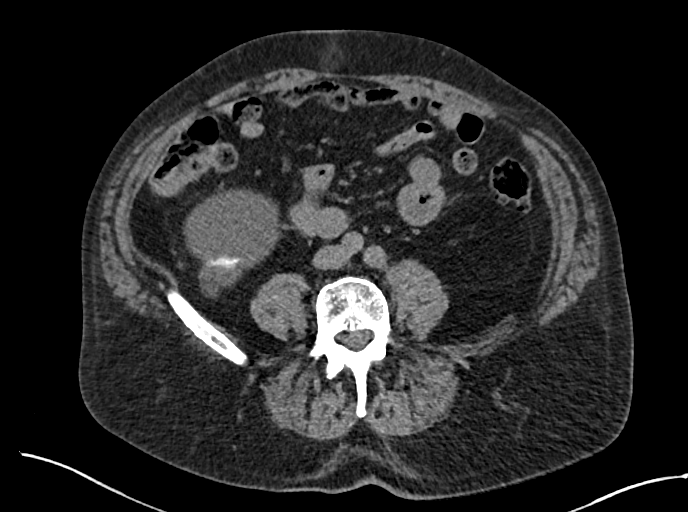
[im 85/139  soft-tissue]
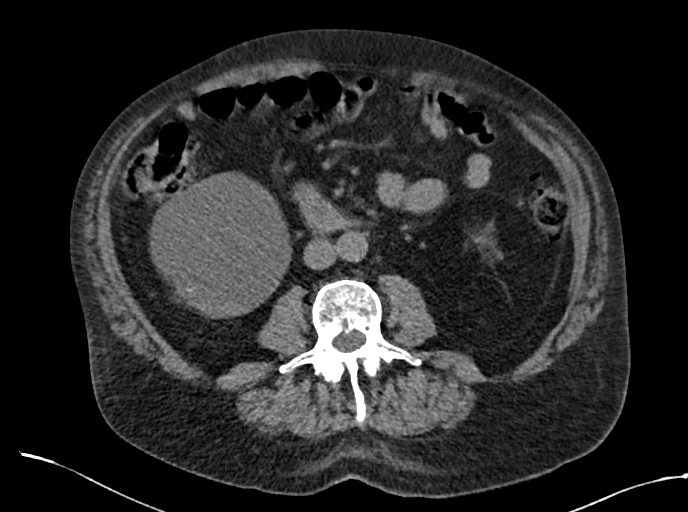
[im 98/139  soft-tissue]
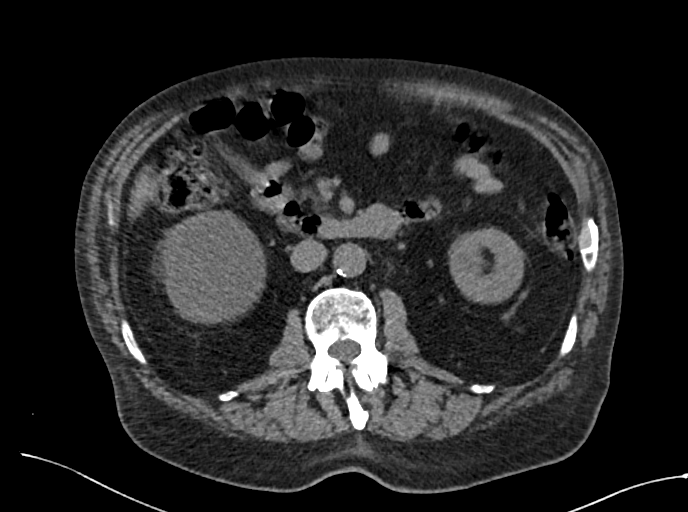
[im 116/139  soft-tissue]
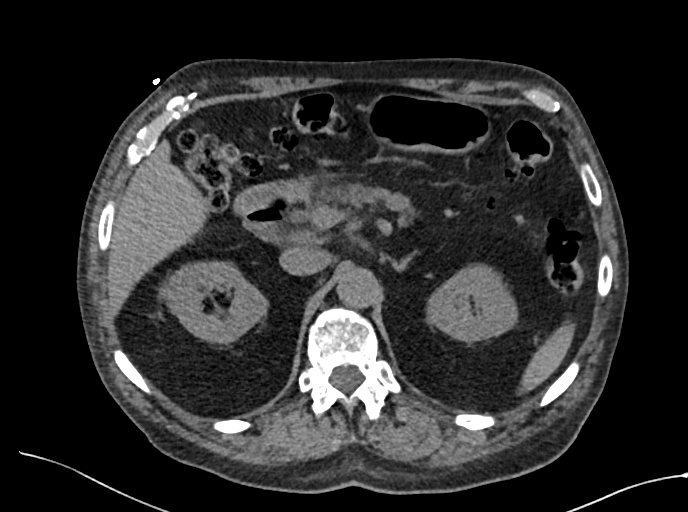
[im 130/139  soft-tissue]
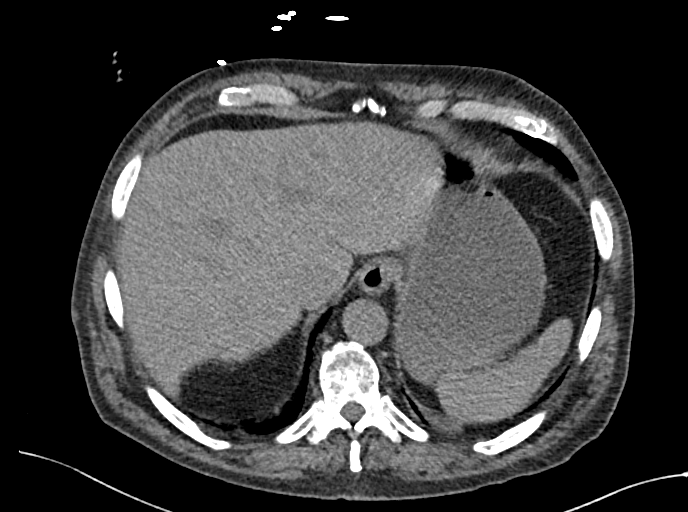
[im 130/139  bone]
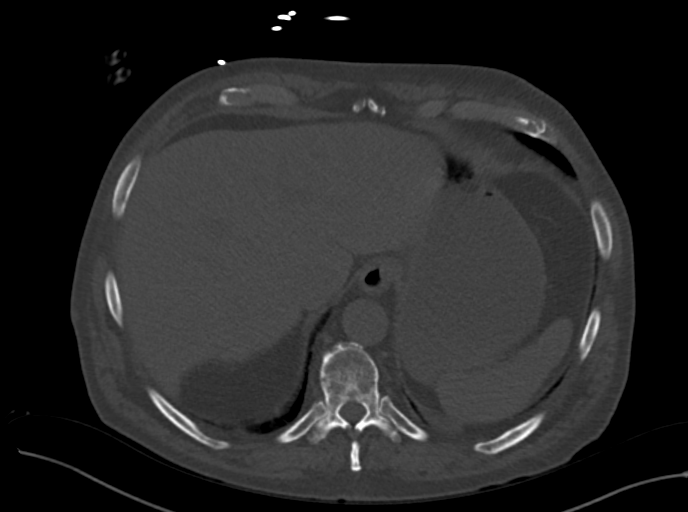

[Series 4: abdomen_pelvis cor 3.00 br40 s3 · coronal · 0.84mm/px · 3 of 98 slices shown]
[im 33/98  soft-tissue]
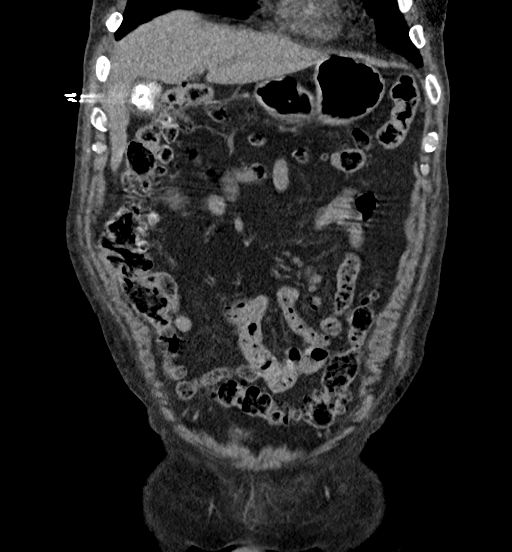
[im 44/98  soft-tissue]
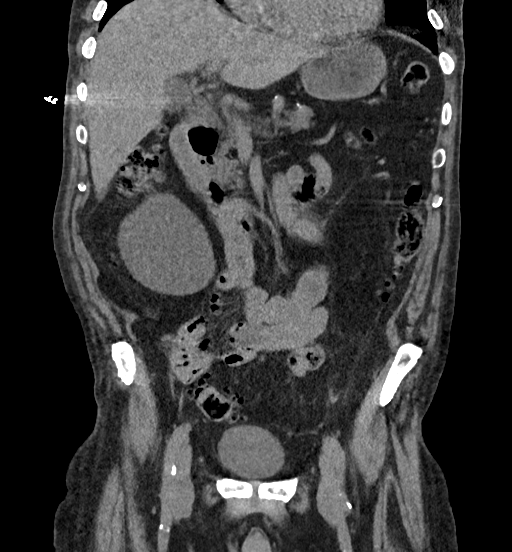
[im 54/98  soft-tissue]
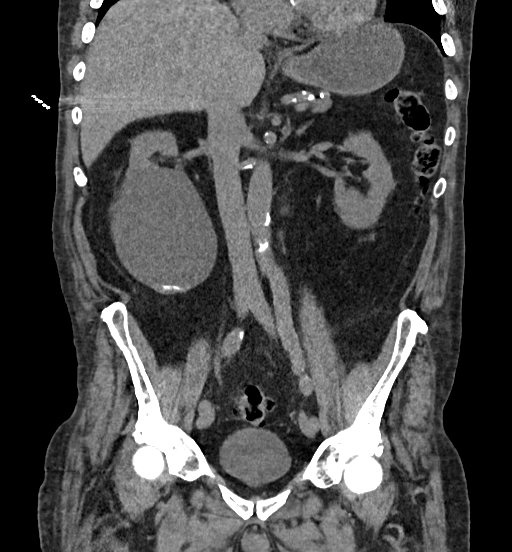

[12 of 46 positions shown; findings below may reference images not displayed]

All CT scans at this facility use dose modulation, interval reconstruction, and/or weight-based
dosing when appropriate to reduce radiation dose to as low as reasonably achievable.

Number of previous computed tomography exams in the last 12 months is 0. Number of previous nuclear
medicine myocardial perfusion studies in the last 12 months is 0.
FINDINGS: LUNG BASES:  Mild dependent changes/atelectasis.

HEART:  The heart is normal in size and position.

ABDOMEN:

LIVER:  NO mass or abnormal liver enlargement.

GALLBLADDER AND BILE DUCTS:  The gallbladder multiple small is moderately contracted with pseudo-
thickening. Moderate gallstones. if clinically indicated, follow-up is recommended with ultrasound.

No ductal dilation.

PANCREAS:  NO evidence of edema/inflammatory changes. NO definite mass.

SPLEEN:  NO mass or abnormal splenic enlargement.

ADRENALS:  NO suspicious masses or abnormal enlargement.

KIDNEYS AND URETERS:  The LEFT kidney demonstrates NO hydronephrosis, obstructing stones or masses.

The RIGHT kidney demonstrates NO hydronephrosis, obstructing stones or masses.

Large lower pole renal cyst in the on the RIGHT. This measures approximately 11 cm.

URETERS: NO definite ureteral stones.

STOMACH AND BOWEL:  Complex-appearing air collection in the area of the pancreas consistent with a
duodenal diverticulum.

The stomach demonstrates NO abnormal thickening or distention.

NO abnormal thickening or distention of the small bowel.

Marked colonic diverticulosis. NO evidence of diverticulitis.

Small to moderate amount of fecal material in the colon. NO distention.

PELVIS:

APPENDIX:  The appendix is not definitely visualized.

BLADDER:  NO significant wall thickening or stones.

ABDOMEN and PELVIS:

BONES/JOINTS:  NO definite acute compression fractures.

Mild degenerative changes in the spine.

NO evidence of acute fractures in the nonspinal bony structures.

Degenerative changes in the hips and SI joints.

SOFT TISSUES:  Soft tissues demonstrate NO fluid collections or foreign body.

Small umbilical/paraumbilical hernia. Small amount of fluid within the hernia.

VASCULATURE:  The aorta demonstrates NO aneurysmal distention or rupture.  There is mild arterial
vascular calcification.

LYMPH NODES:  NO pelvic mass or significant lymph node enlargement.
IMPRESSION: - Small umbilical/paraumbilical hernia. Small amount of fluid within the hernia.  This is
nonspecific. Correlate for symptoms.

- The gallbladder multiple small is moderately contracted with pseudo-thickening. Moderate
gallstones. if clinically indicated, follow-up is recommended with ultrasound.

- Large lower pole renal cyst in the on the RIGHT. This measures approximately 11 cm.

- Small to moderate amount of fecal material in the colon. NO distention.

Additional chronic/incidental findings as described.

Tech Notes:

n/v that started this afternoon, fever, shaking, no hx of cancer, no abd pain.ct/nm 0/0

## 2021-08-26 IMAGING — US ABDLM
1 series · 12 of 25 positions shown · non-contrast
Comparison: none

[Series 1: us abdomen limited · 12 of 82 slices shown]
[im 4/82]
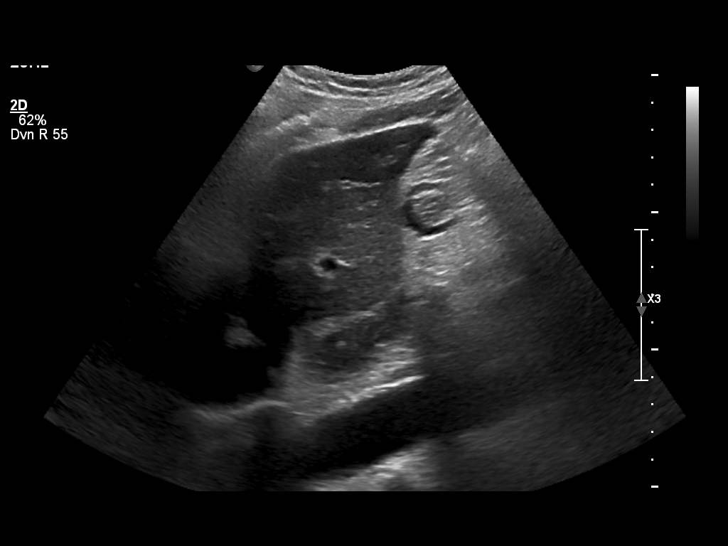
[im 11/82]
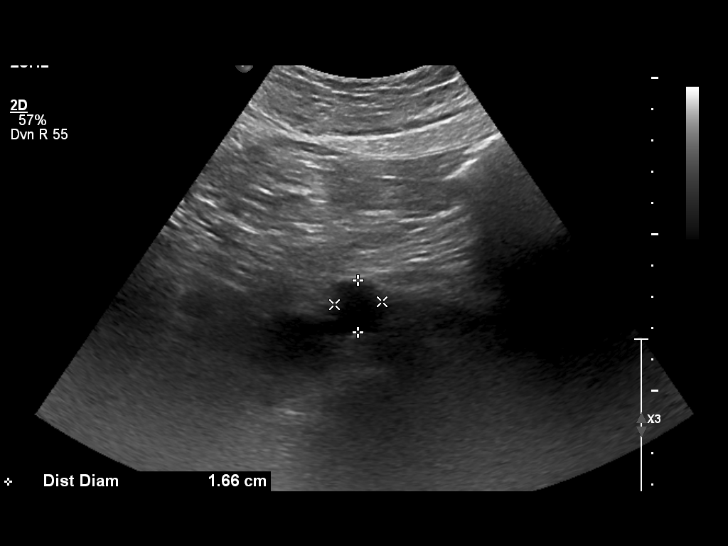
[im 17/82]
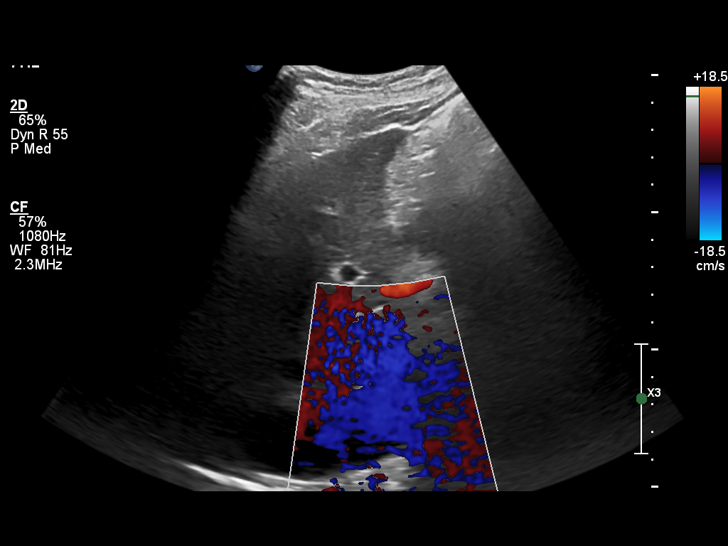
[im 24/82]
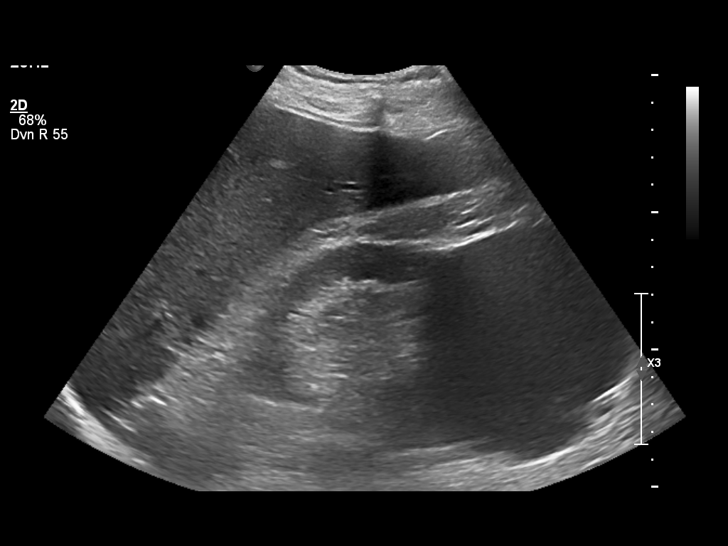
[im 31/82]
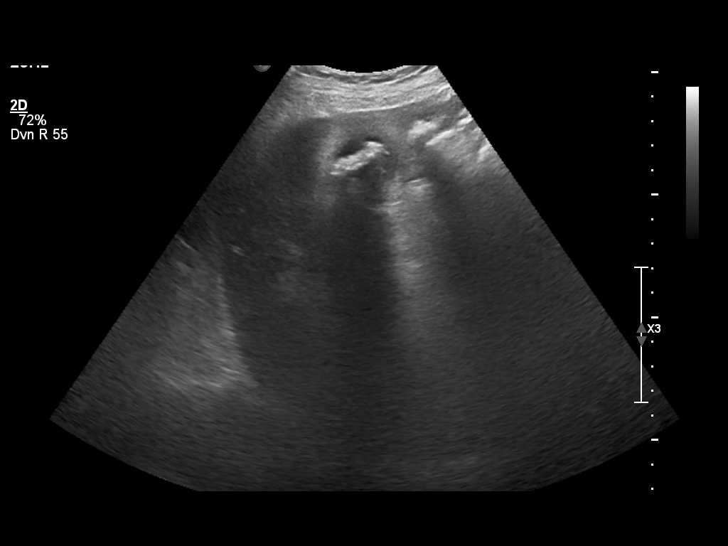
[im 38/82]
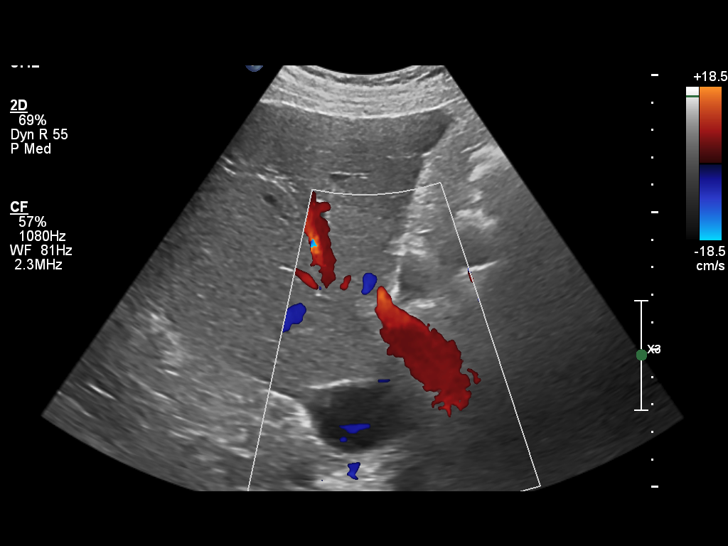
[im 44/82]
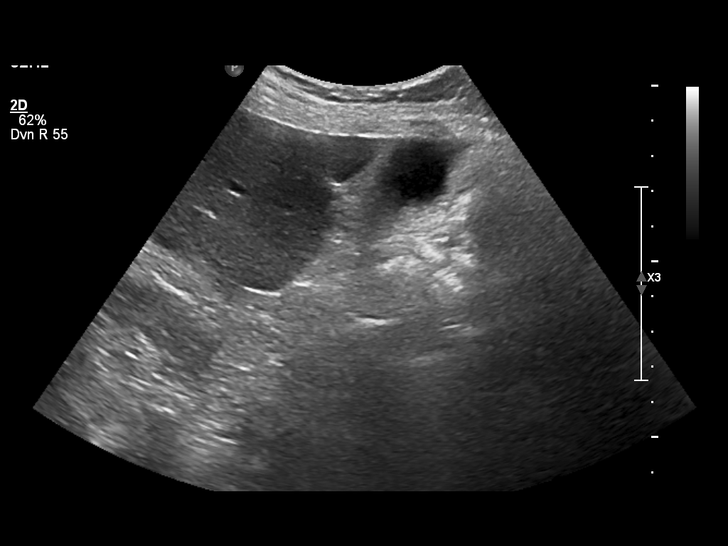
[im 51/82]
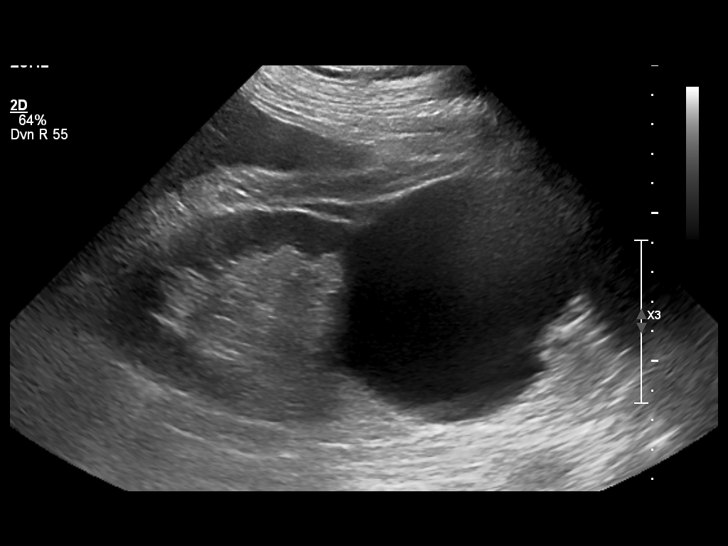
[im 58/82]
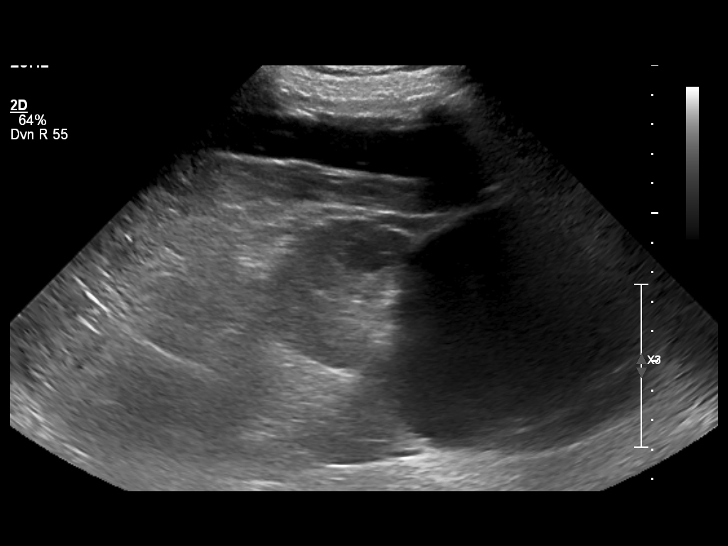
[im 65/82]
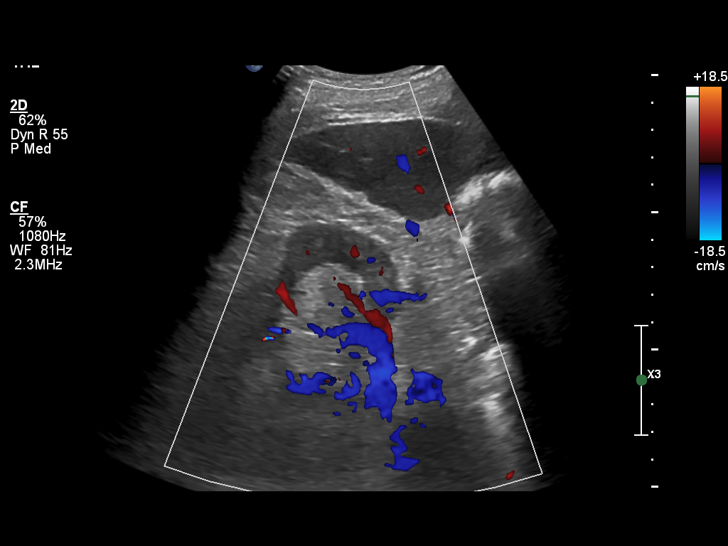
[im 71/82]
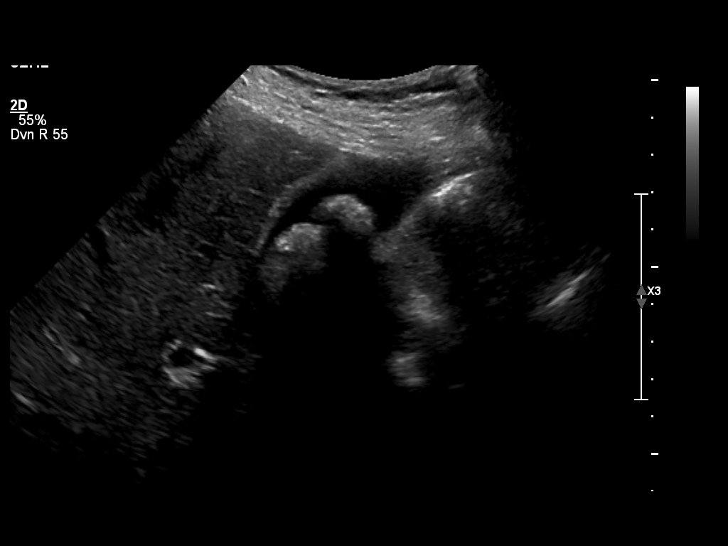
[im 78/82]
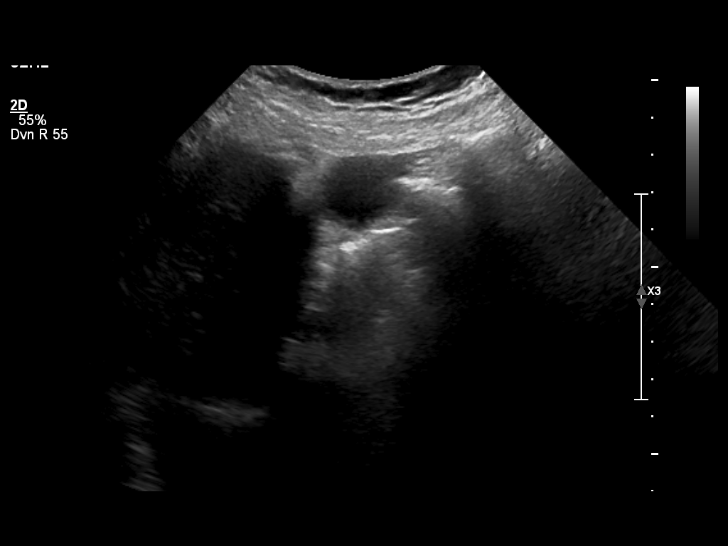

[12 of 25 positions shown; findings below may reference images not displayed]

------------- REPORT [id number] -------------
EXAM

US ABDOMEN COMPLETE

INDICATION

gall bladder/biliary tree
ruq pain; abnormal ct

COMPARISONS

None available at the time of dictation.

FINDINGS

The liver measures 17.16  cm, normal . There is normal  echogenicity and there is no suspicious
focal lesion.

The common bile duct measures  .4  cm. The gallbladder wall measures .37  cm. Multiple shadowing
stones are visualized within the gallbladder lumen, with the largest stone measuring up to 2.4cm.
Positive sonographic Murphy's sign.

Limited evaluation of the pancreas secondary to overlying bowel gas and body habitus.

The right kidney measures 10.3 cm, Normal echogenicity.  A large anchoic cyst is seen measuring up
to 8.3cm.

The maximal AP and transverse dimensions of the abdominal aorta measure 2.3  x 1.9  cm, within
normal limits. No aneurysmal dilatation of the entire aorta.

IMPRESSION

Choledocholithiasis with findings evidence of acute cholecystitis.

Tech Notes:

ruq pain; abnormal ct

------------- REPORT [id number] -------------
**ADDENDUM**
ADDENDUM

Choledocholithiasis with findings suggestive of acute cholecystitis

TD/TT: /

EXAM

US ABDOMEN COMPLETE

INDICATION

gall bladder/biliary tree
ruq pain; abnormal ct

COMPARISONS

None available at the time of dictation.

FINDINGS

The liver measures 17.16  cm, normal . There is normal  echogenicity and there is no suspicious
focal lesion.

The common bile duct measures  .4  cm. The gallbladder wall measures .37  cm. Multiple shadowing
stones are visualized within the gallbladder lumen, with the largest stone measuring up to 2.4cm.
Positive sonographic Murphy's sign.

Limited evaluation of the pancreas secondary to overlying bowel gas and body habitus.

The right kidney measures 10.3 cm, Normal echogenicity.  A large anchoic cyst is seen measuring up
to 8.3cm.

The maximal AP and transverse dimensions of the abdominal aorta measure 2.3  x 1.9  cm, within
normal limits. No aneurysmal dilatation of the entire aorta.

IMPRESSION

Choledocholithiasis with findings evidence of acute cholecystitis.

Tech Notes:

ruq pain; abnormal ct

------------- REPORT [id number] -------------
**ADDENDUM**
ADDENDUM
1. Cholelithiasis with imaging suggestion of acute cholecystitis without definitive evidence of
choledocholithiasis.

TD/TT: /

**ADDENDUM**
ADDENDUM

Choledocholithiasis with findings suggestive of acute cholecystitis

TD/TT: /

EXAM

US ABDOMEN COMPLETE

INDICATION

gall bladder/biliary tree
ruq pain; abnormal ct

COMPARISONS

None available at the time of dictation.

FINDINGS

The liver measures 17.16  cm, normal . There is normal  echogenicity and there is no suspicious
focal lesion.

The common bile duct measures  .4  cm. The gallbladder wall measures .37  cm. Multiple shadowing
stones are visualized within the gallbladder lumen, with the largest stone measuring up to 2.4cm.
Positive sonographic Murphy's sign.

Limited evaluation of the pancreas secondary to overlying bowel gas and body habitus.

The right kidney measures 10.3 cm, Normal echogenicity.  A large anchoic cyst is seen measuring up
to 8.3cm.

The maximal AP and transverse dimensions of the abdominal aorta measure 2.3  x 1.9  cm, within
normal limits. No aneurysmal dilatation of the entire aorta.

IMPRESSION

Choledocholithiasis with findings evidence of acute cholecystitis.

Tech Notes:

ruq pain; abnormal ct

## 2021-10-17 ENCOUNTER — Encounter: Admit: 2021-10-17 | Discharge: 2021-10-17 | Payer: MEDICARE

## 2021-10-17 MED ORDER — LOSARTAN 100 MG PO TAB
ORAL_TABLET | Freq: Every day | ORAL | 3 refills | 30.00000 days | Status: AC
Start: 2021-10-17 — End: ?

## 2021-10-29 ENCOUNTER — Encounter: Admit: 2021-10-29 | Discharge: 2021-10-29 | Payer: MEDICARE

## 2021-10-29 MED ORDER — HYDROCHLOROTHIAZIDE 25 MG PO TAB
ORAL_TABLET | Freq: Every day | 0 refills
Start: 2021-10-29 — End: ?

## 2022-03-17 ENCOUNTER — Encounter: Admit: 2022-03-17 | Discharge: 2022-03-17 | Payer: MEDICARE

## 2022-03-17 MED ORDER — ATORVASTATIN 40 MG PO TAB
ORAL_TABLET | 0 refills
Start: 2022-03-17 — End: ?

## 2022-04-14 ENCOUNTER — Encounter: Admit: 2022-04-14 | Discharge: 2022-04-14 | Payer: MEDICARE

## 2022-04-14 MED ORDER — PRADAXA 150 MG PO CAP
ORAL_CAPSULE | 0 refills
Start: 2022-04-14 — End: ?

## 2022-05-26 ENCOUNTER — Encounter: Admit: 2022-05-26 | Discharge: 2022-05-26 | Payer: MEDICARE

## 2022-05-26 MED ORDER — METOPROLOL TARTRATE 75 MG PO TAB
ORAL_TABLET | 0 refills
Start: 2022-05-26 — End: ?

## 2022-08-06 IMAGING — MR ANKLTWO
6 of 8 series · 36 of 40 positions shown · non-contrast
Comparison: none

[Series 3: T1 · axial · left · 3.0mm · 0.40mm/px · z∈[+4,+139]mm · 7 of 40 slices shown (1 of 3)]
[im 1/40]
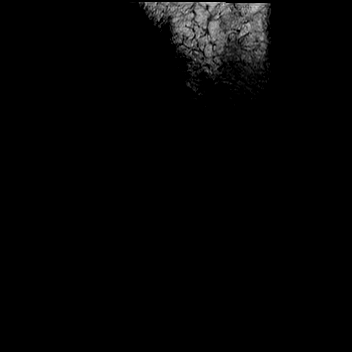
[im 7/40]
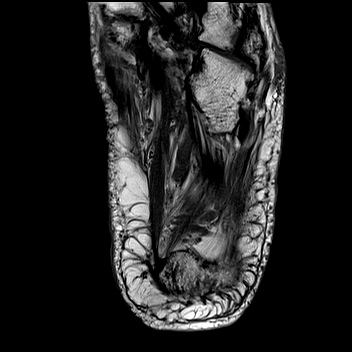
[im 14/40]
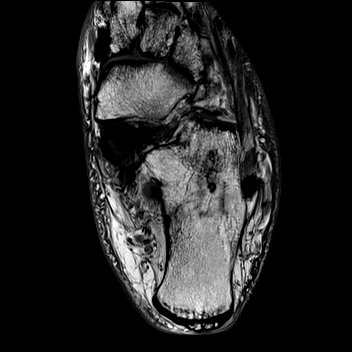
[im 20/40]
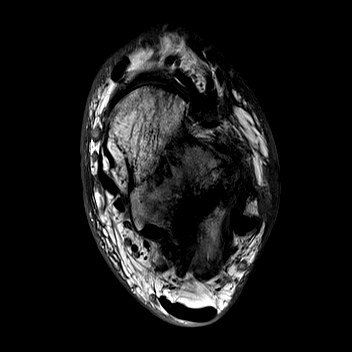
[im 27/40]
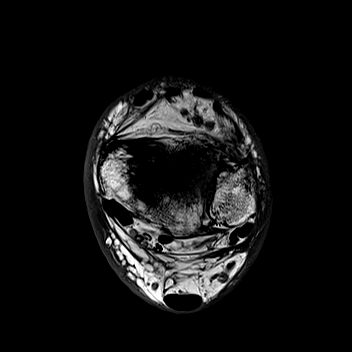
[im 33/40]
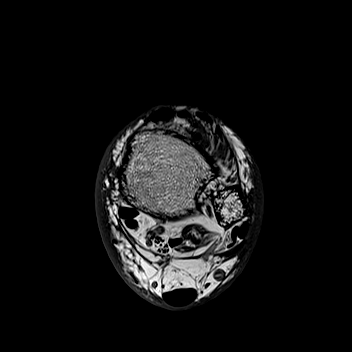
[im 40/40]
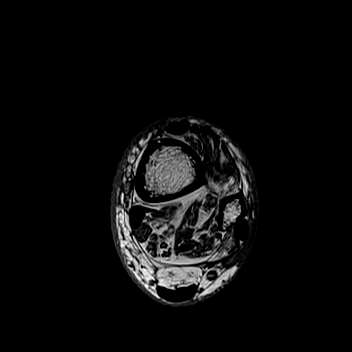

[Series 4: T2 fat-sat · axial · left · 3.0mm · 0.43mm/px · z∈[+1,+136]mm · 7 of 40 slices shown]
[im 1/40]
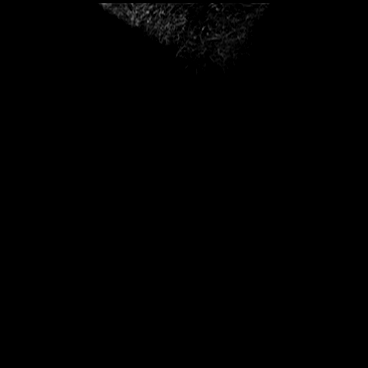
[im 7/40]
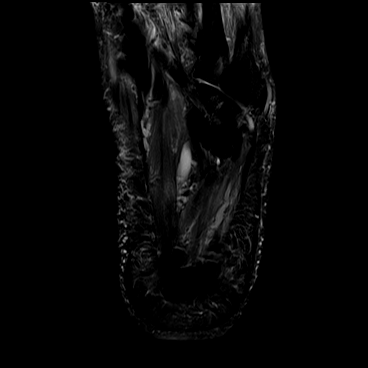
[im 14/40]
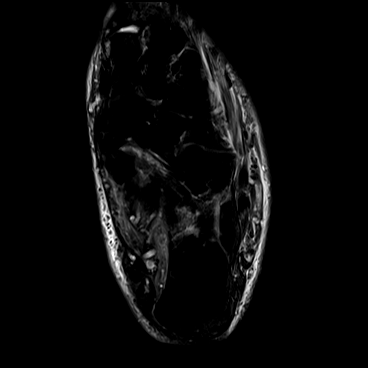
[im 20/40]
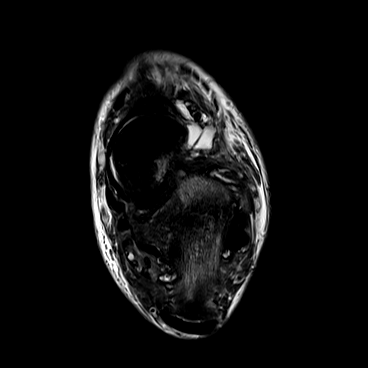
[im 27/40]
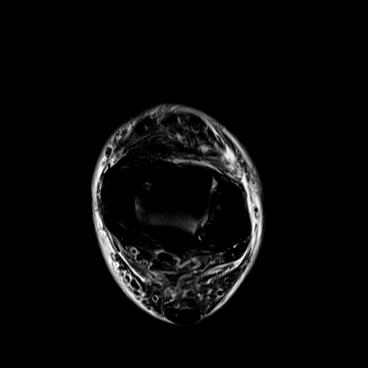
[im 33/40]
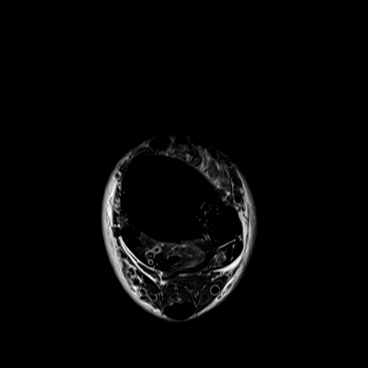
[im 40/40]
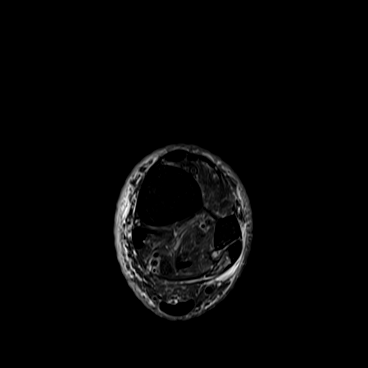

[Series 5: T1 · sagittal · left · 3.0mm · 0.45mm/px · 5 of 30 slices shown (2 of 3)]
[im 1/30]
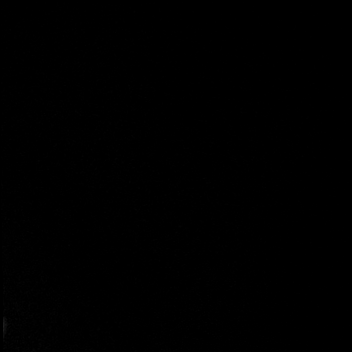
[im 8/30]
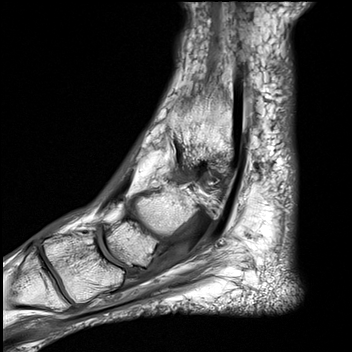
[im 15/30]
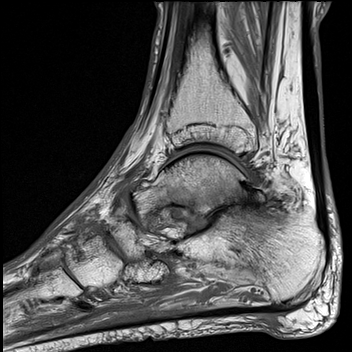
[im 22/30]
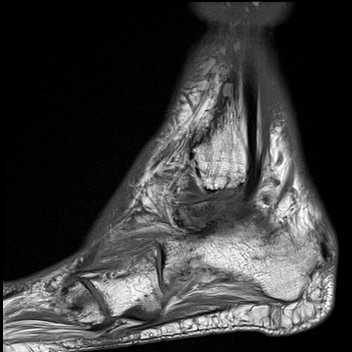
[im 30/30]
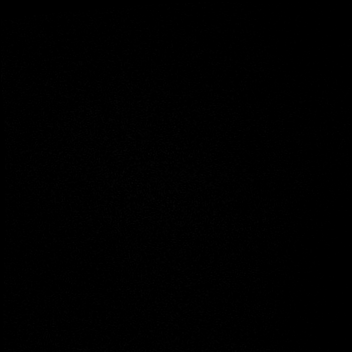

[Series 6: STIR · sagittal · left · 3.0mm · 0.62mm/px · 5 of 30 slices shown (1 of 2)]
[im 1/30]
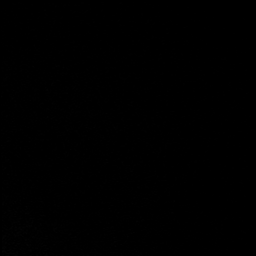
[im 8/30]
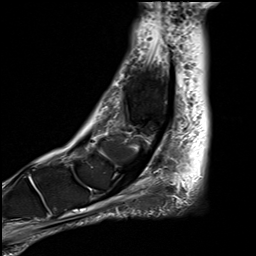
[im 15/30]
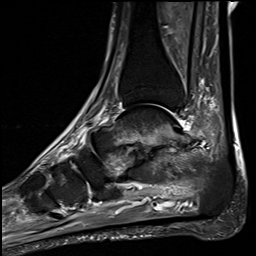
[im 22/30]
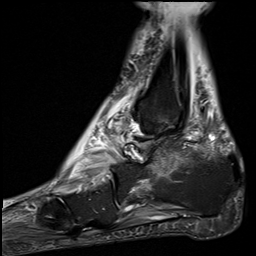
[im 30/30]
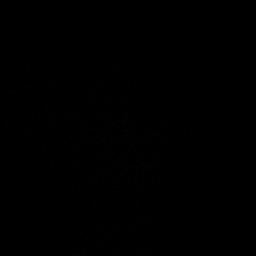

[Series 7: STIR · coronal · left · 3.0mm · 0.53mm/px · 6 of 35 slices shown (2 of 2)]
[im 1/35]
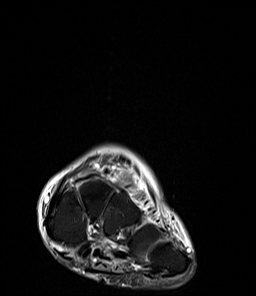
[im 7/35]
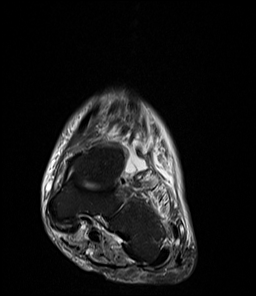
[im 14/35]
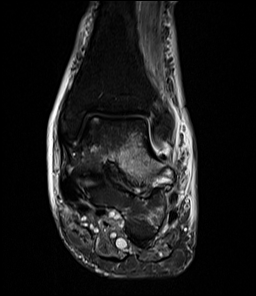
[im 21/35]
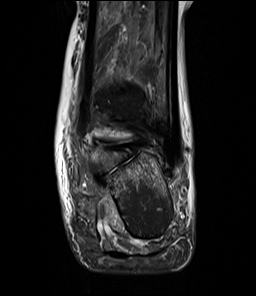
[im 28/35]
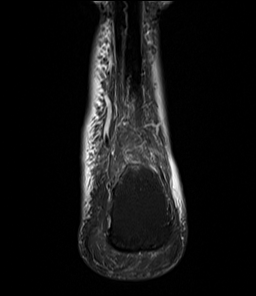
[im 35/35]
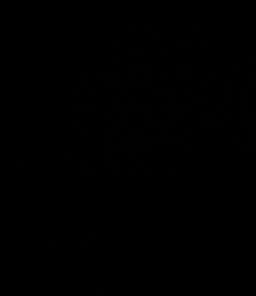

[Series 8: T1 · coronal · left · 3.0mm · 0.42mm/px · 6 of 32 slices shown (3 of 3)]
[im 1/32]
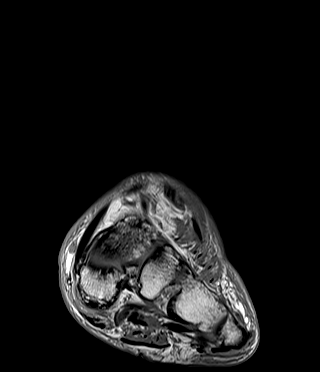
[im 7/32]
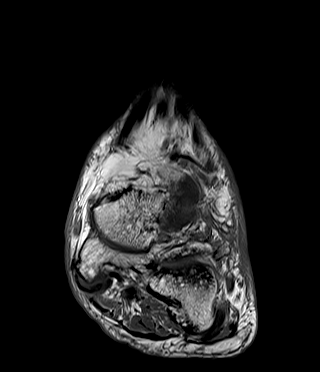
[im 13/32]
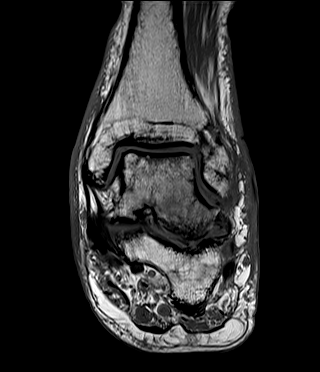
[im 19/32]
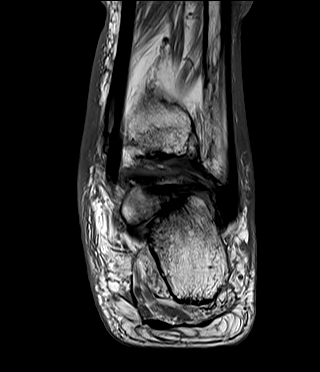
[im 25/32]
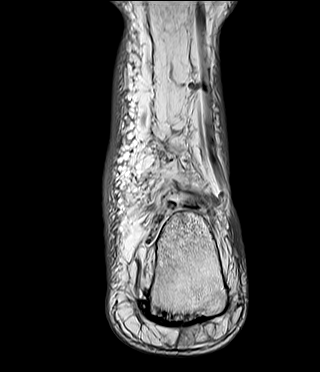
[im 32/32]
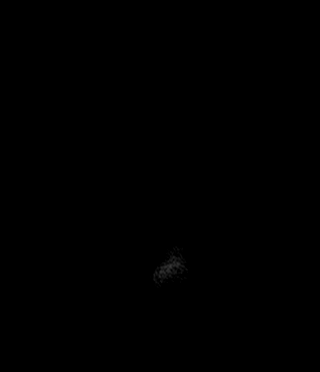

[36 of 40 positions shown; findings below may reference images not displayed]

DIAGNOSTIC STUDIES

EXAM

MR ankle LT wo con

INDICATION

peroneaal tendon of left foot
LEFT LATERAL ANKLE PAIN WITH SWELLING AFTER SPRAIN. RG

TECHNIQUE

AP lateral oblique views

COMPARISONS

None available

FINDINGS

Minimal T2 signal in the Achilles tendon is seen suggesting mild tendinosis.

The flexor and extensor tendons are normal. The peroneal tendons also demonstrate normal signal
intensity.

The anterior and posterior inferior tibiofibular ligaments are normal. The anterior talofibular
ligament is torn. The posterior talar fibular ligament is unremarkable.

The calcaneal fibular ligament, deltoid ligament, and spring ligaments are within normal limits.

There is diffuse marrow edema involving the subtalar joint space likely due to advanced degenerative
change. Underlying neuropathic change should also be considered. Ankle effusion is noted. There is
soft tissue edema diffusely with which may be posttraumatic or represent dependent edema.

IMPRESSION

Marked edema and narrowing of the subtalar joint space particularly posteriorly which likely is
degenerative. Neuropathic change could account for this appearance as well. The remaining joint
spaces however generally well preserved with minor degenerative changes of the tibiotalar joint
space.

Ankle effusion and surrounding soft tissue edema.

Mild tendinosis of the Achilles tendon.

Tech Notes:

LEFT LATERAL ANKLE PAIN WITH SWELLING AFTER SPRAIN.  RG

## 2022-09-02 ENCOUNTER — Encounter: Admit: 2022-09-02 | Discharge: 2022-09-02 | Payer: MEDICARE

## 2022-09-02 MED ORDER — ATORVASTATIN 40 MG PO TAB
ORAL_TABLET | 0 refills
Start: 2022-09-02 — End: ?

## 2022-09-04 ENCOUNTER — Encounter: Admit: 2022-09-04 | Discharge: 2022-09-04 | Payer: MEDICARE

## 2022-09-19 ENCOUNTER — Encounter: Admit: 2022-09-19 | Discharge: 2022-09-19 | Payer: MEDICARE

## 2022-09-24 ENCOUNTER — Encounter: Admit: 2022-09-24 | Discharge: 2022-09-24 | Payer: MEDICARE

## 2022-09-25 ENCOUNTER — Ambulatory Visit: Admit: 2022-09-25 | Discharge: 2022-09-26 | Payer: MEDICARE

## 2022-09-25 ENCOUNTER — Encounter: Admit: 2022-09-25 | Discharge: 2022-09-25 | Payer: MEDICARE

## 2022-09-25 DIAGNOSIS — E119 Type 2 diabetes mellitus without complications: Secondary | ICD-10-CM

## 2022-09-25 DIAGNOSIS — R42 Dizziness and giddiness: Secondary | ICD-10-CM

## 2022-09-25 DIAGNOSIS — I4819 Other persistent atrial fibrillation: Secondary | ICD-10-CM

## 2022-09-25 DIAGNOSIS — Z9229 Personal history of other drug therapy: Secondary | ICD-10-CM

## 2022-09-25 DIAGNOSIS — I4891 Unspecified atrial fibrillation: Secondary | ICD-10-CM

## 2022-09-25 DIAGNOSIS — R0989 Other specified symptoms and signs involving the circulatory and respiratory systems: Secondary | ICD-10-CM

## 2022-09-25 DIAGNOSIS — N400 Enlarged prostate without lower urinary tract symptoms: Secondary | ICD-10-CM

## 2022-09-25 DIAGNOSIS — E785 Hyperlipidemia, unspecified: Secondary | ICD-10-CM

## 2022-09-25 DIAGNOSIS — I10 Essential (primary) hypertension: Secondary | ICD-10-CM

## 2022-09-25 NOTE — Patient Instructions
Thank you for visiting our office today.    We would like to make the following medication adjustments:  NONE       Otherwise continue the same medications as you have been doing.          We will be pursuing the following tests after your appointment today:       Orders Placed This Encounter    ECG 12-LEAD         We will plan to see you back in 12 months.  Please call us in the meantime with any questions or concerns.        Please allow 5-7 business days for our providers to review your results. All normal results will go to MyChart. If you do not have Mychart, it is strongly recommended to get this so you can easily view all your results. If you do not have mychart, we will attempt to call you once with normal lab and testing results. If we cannot reach you by phone with normal results, we will send you a letter.  If you have not heard the results of your testing after one week please give us a call.       Your Cardiovascular [facility] [place] Team (Steve, Lisa, Jamie, Melanie, and Orland Visconti)  phone number is [phone].

## 2022-09-25 NOTE — Progress Notes
Date of Service: 09/25/2022    Miguel Howe is a 88 y.o. male.       HPI    Miguel Howe is followed for permanent atrial fibrillation.??He had a good response after undergoing left knee surgery in 2021 but he is now having difficulty in his left ankle.  He has purchased special inserts for his shoes and received an injection without much effect and he is now participating in physical therapy. 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 ?Otherwise, over the past 6?months he has been stable. He checks his blood pressure and pulse at home daily and his computer keeps a running average of his?blood pressure?and pulse. ?His blood pressure averages 114 over 75 mmHg with a pulse of 68 bpm.?He has?otherwise?felt well and reports no angina, congestive symptoms, palpitations, sensation of sustained forceful heart pounding, lightheadedness or syncope. The patient reports no myalgias, bleeding abnormalities,?or strokelike symptoms.??Miguel Howe reports no recent hospitalizations or emergency room visits over the past 6 months. He has been tolerating his medications without difficulty.   ?  Historically, Miguel Howe presented to medical attention on August 08, 2010 when he noticed unsteadiness and dizziness while playing golf. He reports that the first 9 holes of golf that he played were unremarkable, and that he became unsteady when he started playing the back 9. At the time he believes that he may have had some focal weakness in his left leg and some numbness in his left leg as well. This persisted only for 10-15 minutes and he believes that he did not mention it in the Emergency Department because he has intermittent sciatica and lower back pain and attributed the abnormality to his chronic back disorder. He stopped playing golf and went to the Emergency Department and was seen in the Emergency Department at approximately 12:45 that afternoon. He was noted to have supraventricular tachycardia with a rate of 168 beats per minute. The Emergency Department note indicated that his supraventricular tachycardia converted spontaneously with an involuntary Valsalva that occurred while an IV was being placed. Miguel Howe reported no other focal neurologic abnormalities. He reported no change in vision or difficulty with speech. 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 When I saw Miguel Howe on 08/15/10 his BP was elevated and he was started on losartan 50 mg daily. His BP was still elevated on follow-up and his losartan was increased to 100 mg daily. On 08/23/10 he reported intermittent tingling involving his left hand and he was hospitalized at [facility] for stroke evaluation. Prior to discharge on 08/26/10 he was noted to have paroxysmal atrial flutter and he was started on Pradaxa. Miguel Howe reported some transient hematuria in October 2011. He reports that he was seen by his urologist and his hematuria resolved. When I saw Miguel Howe on 03/16/11 I noticed that his BP had been lower and he reported that he felt somewhat lethargic and orthostatic when his BP was lower. His Maxzide was stopped. Also his blood sugar was markedly elevated at 555 mg/dl. His metformin was increased and he markedly decreased his carbohydrate  intake.   While Miguel Howe is unclear clinically whether he has had a stroke, a CT head scan from 08/24/10 showed MILD INVOLUTIONAL CHANGES AND SMALL SUBACUTE TO CHRONIC RIGHT BASAL GANGLIA LACUNAR INFARCT. In addition, the neurology discharge note from 08/27/10 lists subacute to chronic stroke as a discharge diagnosis. This along with age, hypertension and diabetes would yield a CHA2DS2-VASc score of 6. 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 ?When he was seen on 10/06/15 he was noted to be in atrial fibrillation. Treatment alternatives were presented to the patient and he wanted to proceed with cardioversion. Cardioversion was attempted on 10/10/15 but was unsuccessful. ?He has selected a strategy of heart rate control and anticoagulation and does not want to attempt repeat cardioversion or consider antiarrhythmic therapy or arrhythmia ablation. He underwent arthroscopic left knee surgery in August 2021 with excellent results..           Vitals:    09/25/22 0840   BP: 106/62   BP Source: Arm, Right Upper   Pulse: 91   SpO2: 100%   O2 Device: None (Room air)   PainSc: Zero   Weight: 87.5 kg (193 lb)   Height: 179.1 cm (5' 10.5)     Body mass index is 27.3 kg/m?[name]     Past Medical History  Patient Active Problem List    Diagnosis Date Noted   ? Paroxysmal atrial flutter (HCC) 07/27/2014   ? Left arm numbness 08/23/2010   ? Arrhythmia 08/23/2010   ? HTN (hypertension) 08/23/2010   ? Dizziness 08/15/2010   ? HTN (hypertension) 08/15/2010   ? Hyperlipoproteinemia 08/15/2010   ? Atrial fibrillation (HCC) 08/15/2010   ? Diabetes (HCC) 08/15/2010   ? BPH (benign prostatic hypertrophy) 08/15/2010   ? Dyslipidemia 08/15/2010         Review of Systems   Constitutional: Negative.   HENT: Negative.    Eyes: Negative.    Cardiovascular: Negative.    Respiratory: Negative.    Endocrine: Negative.    Hematologic/Lymphatic: Negative.    Skin: Negative.    Musculoskeletal: Negative.    Gastrointestinal: Negative.    Genitourinary: Negative.    Neurological: Negative.    Psychiatric/Behavioral: Negative.    Allergic/Immunologic: Negative.        Physical Exam  GENERAL: The patient is well developed, well nourished, resting comfortably and in no distress. ?  HEENT: No abnormalities of the visible oro-nasopharynx, conjunctiva or sclera are noted. ?  NECK: There is no jugular venous distension. Carotids are palpable and without bruits. There is no thyroid enlargement. ?  Chest: Lung fields are clear to auscultation. There are no wheezes or crackles. ?  CV: There is an?irregular rhythm with an apical rate of 80?BPM. Variable intensity of S1. There are no murmurs, gallops or rubs. ?  ABD: The abdomen is soft and supple with normal bowel sounds. There is no hepatosplenomegaly, ascites, tenderness, masses or bruits. Ventral hernia noted.  Neuro: There are no focal motor defects. Ambulation is normal. Cognitive function appears normal. ?  Ext:?There is 1+ bipedal edema without evidence of deep vein thrombosis. Peripheral pulses are satisfactory. ?  SKIN:?There are no rashes and no cellulitis ?  PSYCH:?The patient is calm, rationale and oriented    Cardiovascular Studies  A twelve-lead ECG was obtained on 09/25/2022 and reveals atrial fibrillation with a heart rate of 96 bpm.  A ventricular premature beat or aberrant conduction is  noted.  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  Echo Doppler 07/15/2020:  Left ventricular systolic function is within normal limits.  LVEF 65%  Normal right ventricular systolic function.   Mild mitral regurgitation.   Aortic valve sclerosis.   No pericardial effusion.   Estimated peak systolic pulmonary artery pressure is 32 mmHg.?  ?  Regadenoson thallium stress test 07/01/2020:  Scintigraphic (planar/tomographic)::??Summed Stress Score: ?0 ??, Summed Rest Score: ?0.?Post stress planar projection images show normal lung uptake no transit dilatation. ?Planar views are unremarkable. ?Tomography shows homogeneous uptake of tracer. ?Quantitative polar maps are statistically normal. Regional Wall Thickening and Motion Post Stress: No definite thickening abnormality. ?In the presence of a variable RR interval regional motion abnormalities are somewhat problematic. Left Ventricular Ejection Fraction (post stress, in the resting state) =??68 %. ?Left Ventricular End Diastolic Volume: 59 mL  SUMMARY/OPINION:???This study is normal. ?No definite perfusion defects are present. ?Global left ventricular function is within normal limits. ?High risk indicators are not noted. ?The patient is in atrial fibrillation with right bundle branch block and left anterior fascicular block with moderate ventricular response throughout the study. In aggregate the current study is low risk in regards to predicted annual cardiovascular mortality rate.  Cardiovascular Health Factors  Vitals BP Readings from Last 3 Encounters:   09/25/22 106/62   04/25/21 122/66   07/21/20 118/72     Wt Readings from Last 3 Encounters:   09/25/22 87.5 kg (193 lb)   04/25/21 85.3 kg (188 lb)   07/21/20 88 kg (194 lb)     BMI Readings from Last 3 Encounters:   09/25/22 27.30 kg/m?   04/25/21 26.59 kg/m?   07/21/20 27.44 kg/m?      Smoking Social History     Tobacco Use   Smoking Status Never   Smokeless Tobacco Never      Lipid Profile Cholesterol   Date Value Ref Range Status   09/19/2022 114  Final     HDL   Date Value Ref Range Status   09/19/2022 35 (L) >40 Final     LDL   Date Value Ref Range Status   09/19/2022 60  Final     Triglycerides   Date Value Ref Range Status   09/19/2022 96  Final      Blood Sugar Hemoglobin A1C   Date Value Ref Range Status   08/29/2018 7.7 (H) 4.5 - 6.5 Final     Glucose   Date Value Ref Range Status   09/19/2022 101  Final   04/26/2021 134 (H) 70 - 105 Final   08/01/2020 116 (H) 70 - 105 Final          Problems Addressed Today  Encounter Diagnoses   Name Primary?   ? Cardiovascular symptoms Yes       Assessment and Plan     Miguel Howe appears quite stable from a cardiovascular perspective.  He is receiving therapeutic anticoagulation and his heart rate appears well controlled.  He does not want to consider cardioversion, antiarrhythmic therapy or atrial fibrillation ablation.  I am hoping that his left ankle will improve so that he can get back to participating in golf and pitching horseshoes which she enjoys so much.  His blood pressure and LDL cholesterol appear well controlled as well.  His CHA2DS2-VASc score appears to be 4 for age (2 points) diabetes and hypertension. The risks and benefits of anticoagulation therapy have been reviewed with the patient. The patient  understands that anticoagulation is used to decrease thrombotic or clotting complications associated with atrial fibrillation/flutter, such as stroke and systemic embolization which can be disabling or fatal, but can, on occasion, lead to life-threatening bleeding complications including  gastrointestinal and intracranial hemorrhage. The patient wishes to continue anticoagulation with dabigatran.  He could switch to apixaban anytime he wants to but he has done well with dabigatran. Regular mild aerobic exercise, weight loss and adherence to a heart healthy diet were recommended.  I have asked him to return for follow-up in approximately years time. The total time spent during this interview and exam with preparation and chart review was 30 minutes         Current Medications (including today's revisions)  ? acetaminophen (TYLENOL EXTRA STRENGTH) 500 mg tablet Take one tablet by mouth as Needed for Pain. Max of 4,000 mg of acetaminophen in 24 hours.   ? amoxicillin (AMOXIL) 500 mg capsule Take one capsule by mouth. Take as needed for dental appt.   ? atorvastatin (LIPITOR) 40 mg tablet Take 1 tablet by mouth once daily   ? clotrimazole-betamethasone (LOTRISONE) 1-0.05 % topical cream Apply  topically to affected area daily.   ? coenzyme Q10(+) 100 mg PO Cap Take two capsules by mouth daily.   ? empagliflozin (JARDIANCE) 25 mg tablet Take one tablet by mouth daily.   ? finasteride (PROSCAR) 5 mg tablet Take one tablet by mouth daily.     ? glimepiride (AMARYL) 4 mg tablet Take two tablets by mouth daily.     ? hydroCHLOROthiazide (HYDRODIURIL) 25 mg tablet Take 1 tablet by mouth once daily   ? losartan (COZAAR) 100 mg tablet Take 1 tablet by mouth once daily   ? metoprolol tartrate 75 mg tablet Take 1 tablet by mouth twice daily   ? MULTIVITAMIN W-MINERALS/LUTEIN (CENTRUM SILVER PO) Take  by mouth daily.   ? pioglitazone (ACTOS) 30 mg tablet Take one tablet by mouth daily.   ? PRADAXA 150 mg capsule Take 1 capsule by mouth twice daily   ? tamsulosin (FLOMAX) 0.4 mg capsule Take one capsule by mouth daily.

## 2022-09-29 ENCOUNTER — Encounter: Admit: 2022-09-29 | Discharge: 2022-09-29 | Payer: MEDICARE

## 2022-09-29 MED ORDER — HYDROCHLOROTHIAZIDE 25 MG PO TAB
ORAL_TABLET | 0 refills
Start: 2022-09-29 — End: ?

## 2022-09-29 MED ORDER — LOSARTAN 100 MG PO TAB
ORAL_TABLET | 0 refills
Start: 2022-09-29 — End: ?

## 2022-12-04 ENCOUNTER — Encounter: Admit: 2022-12-04 | Discharge: 2022-12-04 | Payer: MEDICARE

## 2022-12-04 MED ORDER — ATORVASTATIN 40 MG PO TAB
ORAL_TABLET | 3 refills | Status: AC
Start: 2022-12-04 — End: ?

## 2023-01-14 ENCOUNTER — Encounter: Admit: 2023-01-14 | Discharge: 2023-01-14 | Payer: MEDICARE

## 2023-01-14 DIAGNOSIS — M25579 Pain in unspecified ankle and joints of unspecified foot: Secondary | ICD-10-CM

## 2023-01-14 DIAGNOSIS — E785 Hyperlipidemia, unspecified: Secondary | ICD-10-CM

## 2023-01-14 DIAGNOSIS — I4892 Unspecified atrial flutter: Secondary | ICD-10-CM

## 2023-01-14 DIAGNOSIS — I10 Essential (primary) hypertension: Secondary | ICD-10-CM

## 2023-01-14 NOTE — Telephone Encounter
-----  Message from Nehemiah Massed, MD sent at 01/07/2023  4:14 PM CST -----  Regarding: RE: ankle problems  Richardson Landry: Faythe Ghee to place referral.  Thanks.  SBG  ----- Message -----  From: Asencion Noble  Sent: 01/07/2023   2:43 PM CST  To: Nehemiah Massed, MD  Subject: ankle problems                                   He said you were going to recommend an ankle expert to his pcp.  Would you like a referral put in? and who would you like?    Thanks   Richardson Landry

## 2023-01-17 ENCOUNTER — Encounter: Admit: 2023-01-17 | Discharge: 2023-01-17 | Payer: MEDICARE

## 2023-02-19 ENCOUNTER — Encounter: Admit: 2023-02-19 | Discharge: 2023-02-19 | Payer: MEDICARE

## 2023-02-19 DIAGNOSIS — M25571 Pain in right ankle and joints of right foot: Secondary | ICD-10-CM

## 2023-02-19 NOTE — Progress Notes
Requested imaging and imaging reports from [facility] via [place].

## 2023-02-22 ENCOUNTER — Encounter: Admit: 2023-02-22 | Discharge: 2023-02-22 | Payer: MEDICARE

## 2023-02-22 ENCOUNTER — Ambulatory Visit: Admit: 2023-02-22 | Discharge: 2023-02-22 | Payer: MEDICARE

## 2023-02-22 DIAGNOSIS — E785 Hyperlipidemia, unspecified: Secondary | ICD-10-CM

## 2023-02-22 DIAGNOSIS — M1990 Unspecified osteoarthritis, unspecified site: Secondary | ICD-10-CM

## 2023-02-22 DIAGNOSIS — M25571 Pain in right ankle and joints of right foot: Secondary | ICD-10-CM

## 2023-02-22 DIAGNOSIS — M19072 Primary osteoarthritis, left ankle and foot: Secondary | ICD-10-CM

## 2023-02-22 DIAGNOSIS — I10 Essential (primary) hypertension: Secondary | ICD-10-CM

## 2023-02-22 DIAGNOSIS — I4891 Unspecified atrial fibrillation: Secondary | ICD-10-CM

## 2023-02-22 DIAGNOSIS — R42 Dizziness and giddiness: Secondary | ICD-10-CM

## 2023-02-22 DIAGNOSIS — E119 Type 2 diabetes mellitus without complications: Secondary | ICD-10-CM

## 2023-02-22 DIAGNOSIS — N400 Enlarged prostate without lower urinary tract symptoms: Secondary | ICD-10-CM

## 2023-02-22 DIAGNOSIS — M549 Dorsalgia, unspecified: Secondary | ICD-10-CM

## 2023-02-22 DIAGNOSIS — I639 Cerebral infarction, unspecified: Secondary | ICD-10-CM

## 2023-02-22 DIAGNOSIS — Z9229 Personal history of other drug therapy: Secondary | ICD-10-CM

## 2023-02-22 MED ORDER — MISCELLANEOUS MEDICAL SUPPLY MISC MISC
0 refills | 1.00000 days | Status: AC
Start: 2023-02-22 — End: ?

## 2023-02-22 MED ORDER — TRIAMCINOLONE ACETONIDE 40 MG/ML IJ SUSP
40 mg | Freq: Once | INTRAMUSCULAR | 0 refills | Status: CP
Start: 2023-02-22 — End: ?
  Administered 2023-02-22: 18:00:00 40 mg via INTRAMUSCULAR

## 2023-02-22 MED ORDER — SODIUM CHLORIDE 0.9 % IJ SOLN
.5 mL | Freq: Once | INTRA_ARTICULAR | 0 refills | Status: CP
Start: 2023-02-22 — End: ?
  Administered 2023-02-22: 18:00:00 0.5 mL via INTRA_ARTICULAR

## 2023-02-22 MED ORDER — ROPIVACAINE (PF) 2 MG/ML (0.2 %) IJ SOLN
10 mL | Freq: Once | INTRAMUSCULAR | 0 refills | Status: CP
Start: 2023-02-22 — End: ?
  Administered 2023-02-22: 18:00:00 1 mL via INTRAMUSCULAR

## 2023-02-22 MED ORDER — LIDOCAINE HCL 10 MG/ML (1 %) IJ SOLN
1 mL | Freq: Once | INTRAMUSCULAR | 0 refills | Status: CP
Start: 2023-02-22 — End: ?
  Administered 2023-02-22: 18:00:00 1 mL via INTRAMUSCULAR

## 2023-02-22 MED ORDER — IOHEXOL 350 MG IODINE/ML IV SOLN
1 mL | Freq: Once | INTRA_ARTICULAR | 0 refills | Status: CP
Start: 2023-02-22 — End: ?
  Administered 2023-02-22: 18:00:00 1 mL via INTRA_ARTICULAR

## 2023-02-22 NOTE — Telephone Encounter
This office did not receive any images or records from [facility] in [place]. This RN contacted their radiology team at [phone] and spoke with Noelle who could not confirm when the patient had his images. This RN explained that per the pt, he had x-rays in August and January, and an MRI in August. Noelle expressed difficult locating the imaging and repeatedly asked to confirm when they were - this RN explained again we only have the dates from the patient but are needing what they have on file for his bilateral feet and ankles. Barth Kirks states she will "tell someone who knows more about this" but declined to transfer this RN to anyone else. This RN attempted to explain that the patient is currently in clinic and we requested the images days ago, but Noelle hung up before this RN could proceed any further.

## 2023-02-22 NOTE — Progress Notes
This RN contacted [facility] records for patient's imaging. Explained that the images had been previously requested without response and that the patient is currently here in clinic so we are needing these STAT. They stated they would push the images via nuance and fax the reports to [phone] now.    Images received via nuance.    Faxed script for custom AFO to [facility] in [place] at fax [phone].

## 2023-02-22 NOTE — Progress Notes
Onset: Last May    CHIEF COMPLAINT: Left ankle pain    HISTORY OF PRESENT ILLNESS: Mr. STAS BLACKLEDGE is a 89 y.o. male who has noted increasing pain on the lateral aspect of his left ankle.  Pain is currently rated 6 out of 10, described as aching along the inside of the ankle, worse with activity, somewhat relieved by rest. Denies pain elsewhere. Treatment to date includes: Orthotics and injection not under ultrasound guidance.  Given the chronicity and severity of their symptoms, patient presents for evaluation and treatment.    PAST MEDICAL HISTORY:   Medical History:   Diagnosis Date    Arthritis     Atrial fibrillation (HCC) 08/15/2010    Back pain     BPH (benign prostatic hypertrophy) 08/15/2010    Diabetes (HCC) 08/15/2010    Dizziness 08/15/2010    Dyslipidemia 08/15/2010    HTN (hypertension) 08/15/2010    HX: anticoagulation     Hyperlipoproteinemia 08/15/2010    Stroke [facility])        MEDICATIONS:     Current Outpatient Medications:     acetaminophen (TYLENOL EXTRA STRENGTH) 500 mg tablet, Take one tablet by mouth as Needed for Pain. Max of 4,000 mg of acetaminophen in 24 hours., Disp: , Rfl:     amoxicillin (AMOXIL) 500 mg capsule, Take one capsule by mouth. Take as needed for dental appt., Disp: , Rfl:     atorvastatin (LIPITOR) 40 mg tablet, Take 1 tablet by mouth once daily, Disp: 90 tablet, Rfl: 3    clotrimazole-betamethasone (LOTRISONE) 1-0.05 % topical cream, Apply  topically to affected area daily., Disp: , Rfl:     coenzyme Q10(+) 100 mg PO Cap, Take two capsules by mouth daily., Disp: , Rfl:     empagliflozin (JARDIANCE) 25 mg tablet, Take one tablet by mouth daily., Disp: , Rfl:     finasteride (PROSCAR) 5 mg tablet, Take one tablet by mouth daily.  , Disp: , Rfl:     glimepiride (AMARYL) 4 mg tablet, Take two tablets by mouth daily.  , Disp: , Rfl:     hydroCHLOROthiazide (HYDRODIURIL) 25 mg tablet, Take 1 tablet by mouth once daily, Disp: 90 tablet, Rfl: 3    losartan (COZAAR) 100 mg tablet, Take 1 tablet by mouth once daily, Disp: 90 tablet, Rfl: 3    metoprolol tartrate 75 mg tablet, Take 1 tablet by mouth twice daily, Disp: 180 tablet, Rfl: 3    Miscellaneous Medical Supply misc, [employer] - rigid AFO - subtalar arthritis and foot deformity of L foot, Disp: 1 each, Rfl: 0    MULTIVITAMIN W-MINERALS/LUTEIN (CENTRUM SILVER PO), Take  by mouth daily., Disp: , Rfl:     pioglitazone (ACTOS) 30 mg tablet, Take one tablet by mouth daily., Disp: , Rfl:     PRADAXA 150 mg capsule, Take 1 capsule by mouth twice daily, Disp: 180 capsule, Rfl: 3    tamsulosin (FLOMAX) 0.4 mg capsule, Take one capsule by mouth daily., Disp: , Rfl:     ALLERGIES:   Aggrenox [aspirin-dipyridamole]      SOCIAL HISTORY:   Social History     Socioeconomic History    Marital status: Married   Tobacco Use    Smoking status: Never    Smokeless tobacco: Never   Substance and Sexual Activity    Alcohol use: No    Drug use: No    Sexual activity: Yes     Partners: Female  FAMILY HISTORY: Notable for   Family History   Problem Relation Age of Onset    Atrial Fibrillation Mother     Pacemaker Mother     Heart Disease Mother     Hypertension Father     Diabetes Father     Heart Attack Father     Heart Disease Father     Emphysema Father          REVIEW OF SYSTEMS: Noncontributory    PHYSICAL EXAM:   Pain 6 out of 10    Psychiatric:   Normal mood and affect, engaging; alert and oriented  Cardiovascular  No pretibial pitting edema  Less than two second capillary refill; palpable DP pulse  Respiratory  Breathing comfortably   Skin  No breaks in the skin, nails intact, swollen over lateral ankle  Neurologic  Sensation grossly intact to light touch, including superficial and deep peroneal nerve, saphenous, sural and tibial distributions  Intact tibialis anterior, posterior tibialis tendon, peroneals, Achilles; normal Thompson?s test  Musculoskeletal  Left-sided non-antalgic gait, difficulty with toe rise  Hindfoot alignment is valgus  Range of motion of the left ankle is supple with a positive Silfverskiold; the subtalar joint does correct past neutral  Negative proximal squeeze, no tenderness along lateral ankle   Tender overlying the posterior tibial tendon where there some bogginess, none overlying the medial malleolus or deltoid; + Pain in the subfibular area  No pain along the anterior process of the calcaneus, fifth metatarsal base or midfoot, including the Lisfranc    IMAGES:  Left foot and ankle weight-bearing films are ordered and visualized and no evidence of fracture or dislocation; there a break in Meary?s line. Decreased calcaneal pitch. 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  Increased talonavicular uncoverage. No fracture or dislocation noted.     Outside MRI dated 2123 demonstrates severe subtalar arthritis with bony edema.  Soft tissue swelling about the ankle.  No obvious peroneal tendon tear.  Ankle mortise is maintained but there is no osteochondral lesion of the talus.    ASSESSMENT AND PLAN: Mr. 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  We discussed the natural history of the above.  We also discussed treatment options including operative and non-operative management.  The patient would like to proceed with non-operative management at this time.  This will include ultrasound-guided corticosteroid injection of the subtalar joint, [employer] prescription for rigid AFO. All questions were answered today and they were comfortable with the plan at the end of the visit.  I will plan to see them back on an as-needed basis.    Torry is ambulatory and has a significant weakness and/or deformity of the midfoot and hindfoot. Therefore, Cobe requires stabilization of the midfoot and hindfoot for aforementioned medical reasons. Lonny definitely has the potential to benefit functionally from the use of an AFO. Due to the amount of underlying deformity of the midfoot and hindfoot, Bernard could not be fit with a prefabricated AFO. The reason why a prefabricated AFO will not fit and the reason why Jacquis needs the custom AFO is due to the complexity and dimensions of underlying deformity. The condition necessitating the orthosis is expected to be permanent or of longstanding duration (more than 6 months). There is a need to control the ankle and foot in more than one plane including sagittal and coronal planes. Dean is highly motivated to ambulate. Berley is an active  adult who does not use assistive devices and routinely ambulates. Nivedh has a documented orthopaedic status that requires custom fabricating over a model to provide efficient stability and to prevent tissue injury.

## 2023-02-22 NOTE — Patient Instructions
Please do not hesitate to contact my office with any questions.    Dr. William Tucker MD - Orthopedic Surgery  [facility] - Phone [phone] - Fax [phone]   [address]    Avigdor Dollar BSN, RN - Clinical Coordinator

## 2023-03-06 ENCOUNTER — Encounter: Admit: 2023-03-06 | Discharge: 2023-03-06 | Payer: MEDICARE

## 2023-03-06 NOTE — Telephone Encounter
Pt LVM stating he needs a prescription for a custom orthotic. Requested that this RN leave detailed message if unable to reach him. This RN attempted to return call but call went straight to voicemail. LVM explaining we did place an order for a custom orthotic at his appt on 3/8, gave him a hard copy of the prescription, and faxed it to [facility] in [place] at his request. This RN offered to refax it or send it somewhere else if pt desired. Direct callback given.

## 2023-03-31 ENCOUNTER — Encounter: Admit: 2023-03-31 | Discharge: 2023-03-31 | Payer: MEDICARE

## 2023-03-31 MED ORDER — DABIGATRAN ETEXILATE 150 MG PO CAP
150 mg | ORAL_CAPSULE | Freq: Two times a day (BID) | ORAL | 0 refills
Start: 2023-03-31 — End: ?

## 2023-05-16 ENCOUNTER — Encounter: Admit: 2023-05-16 | Discharge: 2023-05-16 | Payer: MEDICARE

## 2023-05-16 MED ORDER — METOPROLOL TARTRATE 75 MG PO TAB
ORAL_TABLET | ORAL | 3 refills | 90.00000 days | Status: AC
Start: 2023-05-16 — End: ?

## 2023-07-01 ENCOUNTER — Encounter: Admit: 2023-07-01 | Discharge: 2023-07-01 | Payer: MEDICARE

## 2023-07-01 MED ORDER — DABIGATRAN ETEXILATE 150 MG PO CAP
150 mg | ORAL_CAPSULE | Freq: Two times a day (BID) | ORAL | 1 refills | Status: AC
Start: 2023-07-01 — End: ?

## 2023-08-05 ENCOUNTER — Encounter: Admit: 2023-08-05 | Discharge: 2023-08-05 | Payer: MEDICARE

## 2023-08-05 NOTE — Telephone Encounter
Patient called nursing line stating that he just got out of his appointment with Dr. Alona Bene. Patient states that they increased his diuretic. Patient states that he has scheduled follow up with his PCP on 08/08/23. Patient had negative [place] of legs and X-ray. Patient is on prophylactic antibiotics for infection.

## 2023-08-08 NOTE — Telephone Encounter
Discussed with Dr. Arna Medici in clinic. He states September appointment is reasonable. Should patient develop more symptoms then patient should reach out or report to the ER for worrisome symptoms.

## 2023-08-09 ENCOUNTER — Ambulatory Visit: Admit: 2023-08-09 | Discharge: 2023-08-09 | Payer: MEDICARE

## 2023-08-09 ENCOUNTER — Encounter: Admit: 2023-08-09 | Discharge: 2023-08-09 | Payer: MEDICARE

## 2023-08-09 DIAGNOSIS — M7989 Other specified soft tissue disorders: Secondary | ICD-10-CM

## 2023-08-26 ENCOUNTER — Encounter: Admit: 2023-08-26 | Discharge: 2023-08-26 | Payer: MEDICARE

## 2023-08-27 ENCOUNTER — Encounter: Admit: 2023-08-27 | Discharge: 2023-08-27 | Payer: MEDICARE

## 2023-08-27 DIAGNOSIS — M1990 Unspecified osteoarthritis, unspecified site: Secondary | ICD-10-CM

## 2023-08-27 DIAGNOSIS — R42 Dizziness and giddiness: Secondary | ICD-10-CM

## 2023-08-27 DIAGNOSIS — I4891 Unspecified atrial fibrillation: Secondary | ICD-10-CM

## 2023-08-27 DIAGNOSIS — M549 Dorsalgia, unspecified: Secondary | ICD-10-CM

## 2023-08-27 DIAGNOSIS — I639 Cerebral infarction, unspecified: Secondary | ICD-10-CM

## 2023-08-27 DIAGNOSIS — E785 Hyperlipidemia, unspecified: Secondary | ICD-10-CM

## 2023-08-27 DIAGNOSIS — I10 Essential (primary) hypertension: Secondary | ICD-10-CM

## 2023-08-27 DIAGNOSIS — I4819 Other persistent atrial fibrillation: Secondary | ICD-10-CM

## 2023-08-27 DIAGNOSIS — R0989 Other specified symptoms and signs involving the circulatory and respiratory systems: Secondary | ICD-10-CM

## 2023-08-27 DIAGNOSIS — Z9229 Personal history of other drug therapy: Secondary | ICD-10-CM

## 2023-08-27 DIAGNOSIS — N400 Enlarged prostate without lower urinary tract symptoms: Secondary | ICD-10-CM

## 2023-08-27 DIAGNOSIS — E119 Type 2 diabetes mellitus without complications: Secondary | ICD-10-CM

## 2023-08-27 LAB — BASIC METABOLIC PANEL
ANION GAP: 11
BLD UREA NITROGEN: 23
CALCIUM: 8.7 — ABNORMAL LOW (ref 8.8–10.0)
CHLORIDE: 108 — ABNORMAL HIGH (ref 98–107)
CO2: 22 — ABNORMAL LOW (ref 23–31)
CREATININE: 0.9
GFR ESTIMATED: 80
GLUCOSE,PANEL: 144 — ABNORMAL HIGH (ref 70–105)
POTASSIUM: 4.4
SODIUM: 141

## 2023-08-27 MED ORDER — APIXABAN 5 MG PO TAB
5 mg | ORAL_TABLET | Freq: Two times a day (BID) | ORAL | 3 refills | Status: AC
Start: 2023-08-27 — End: ?

## 2023-08-27 NOTE — Progress Notes
Date of Service: 08/27/2023    Huber Jarosh bob Eastridge is a 89 y.o. male.       HPI   Mr. Tajir Nesbitt is followed for permanent atrial fibrillation.  Mr. Furnish reports that he started noticing swelling in his left leg in late July 2024.  He was initially seen in the emergency room locally on August 03, 2023 and then was admitted to the hospital for 5 days, beginning on 08/08/2023 for swelling and bleeding in his left leg.  His anticoagulation with dabigatran was stopped at that time and has not been restarted.  The bleeding in his left leg has resolved and the swelling has markedly improved.  I am not sure that any specific reason was found for either the swelling or bleeding in his left leg. There was no evidence for deep vein thrombosis. He had tolerated anticoagulation for many, many years prior to this recent episode of leg swelling.  His exercise tolerance has been significantly limited in recent years by his left ankle arthritis.  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  Mr. Sokolik indicates that he is significantly improved over the past 2 weeks.  Currently he reports no angina, congestive symptoms, palpitations, sensation of sustained forceful heart pounding, lightheadedness or syncope. The patient reports no myalgias, new bleeding abnormalities, or strokelike symptoms.  He has been tolerating his current medications without difficulty.      Historically, Mr. Silverstone presented to medical attention on August 08, 2010 when he noticed unsteadiness and dizziness while playing golf. He reports that the first 9 holes of golf that he played were unremarkable, and that he became unsteady when he started playing the back 9. At the time he believes that he may have had some focal weakness in his left leg and some numbness in his left leg as well. This persisted only for 10-15 minutes and he believes that he did not mention it in the Emergency Department because he has intermittent sciatica and lower back pain and attributed the abnormality to his chronic back disorder. He stopped playing golf and went to the Emergency Department and was seen in the Emergency Department at approximately 12:45 that afternoon. He was noted to have supraventricular tachycardia with a rate of 168 beats per minute. The Emergency Department note indicated that his supraventricular tachycardia converted spontaneously with an involuntary Valsalva that occurred while an IV was being placed. Mr. Andersen reported no other focal neurologic abnormalities. He reported no change in vision or difficulty with speech. 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 When I saw Mr. Henness on 08/15/10 his BP was elevated and he was started on losartan 50 mg daily. His BP was still elevated on follow-up and his losartan was increased to 100 mg daily. On 08/23/10 he reported intermittent tingling involving his left hand and he was hospitalized at [facility] for stroke evaluation. Prior to discharge on 08/26/10 he was noted to have paroxysmal atrial flutter and he was started on Pradaxa. Mr. Weitzel reported some transient hematuria in October 2011. He reports that he was seen by his urologist and his hematuria resolved. When I saw Mr. Havranek on 03/16/11 I noticed that his BP had been lower and he reported that  he felt somewhat lethargic and orthostatic when his BP was lower. His Maxzide was stopped. Also his blood sugar was markedly elevated at 555 mg/dl. His metformin was increased and he markedly decreased his carbohydrate intake.   While Mr. Bommer is unclear clinically whether he has had a stroke, a CT head scan from 08/24/10 showed MILD INVOLUTIONAL CHANGES AND SMALL SUBACUTE TO CHRONIC RIGHT BASAL GANGLIA LACUNAR INFARCT. In addition, the neurology discharge note from 08/27/10 lists subacute to chronic stroke as a discharge diagnosis. This along with age, hypertension and diabetes would yield a CHA2DS2-VASc score of 6. 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  When he was seen on 10/06/15 he was noted to be in atrial fibrillation. Treatment alternatives were presented to the patient and he wanted to proceed with cardioversion. Cardioversion was attempted on 10/10/15 but was unsuccessful.  He has selected a strategy of heart rate control and anticoagulation and does not want to attempt repeat cardioversion or consider antiarrhythmic therapy or arrhythmia ablation. He underwent arthroscopic left knee surgery in August 2021 with excellent results..       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   HENT: Negative.     Eyes: Negative.    Cardiovascular: Negative.    Respiratory: Negative.     Endocrine: Negative.    Hematologic/Lymphatic: Negative.    Skin: Negative.    Musculoskeletal: Negative.    Gastrointestinal: Negative.    Genitourinary: Negative.    Neurological: Negative.    Psychiatric/Behavioral: Negative.     Allergic/Immunologic: Negative.      Physical Exam  GENERAL: The patient is well developed, well nourished, resting comfortably and in no distress.    HEENT: No abnormalities of the visible oro-nasopharynx, conjunctiva or sclera are noted.    NECK: There is no jugular venous distension. Carotids are palpable and without bruits. There is no thyroid enlargement.    Chest: Lung fields are clear to auscultation. There are no wheezes or crackles.    CV: There is an irregular rhythm with an apical rate of 76 BPM. Variable intensity of S1. There are no murmurs, gallops or rubs.    ABD: The abdomen is soft and supple with normal bowel sounds. There is no hepatosplenomegaly, ascites, tenderness, masses or bruits. Ventral hernia noted.  Neuro: There are no focal motor defects. Ambulation is normal. Cognitive function appears normal.    Ext: There is trace to1+ bipedal edema without evidence of deep vein  thrombosis. Peripheral pulses are satisfactory.    SKIN: There are no rashes and no cellulitis    PSYCH: The patient is calm, rationale and oriented    Cardiovascular Studies  A twelve-lead ECG obtained on 08/27/2023 shows atrial fibrillation with a heart rate of 82 bpm.  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  Echo Doppler 08/09/2023:  Interpretation Summary  No regional wall motion abnormalities are seen. Overall left ventricular systolic function appears normal. The estimated left ventricular ejection fraction is 60-65%.   Mild basal septal hypertrophy is noted. The Doppler recording obtained from the left ventricular outflow tract has normal velocity and profile and does not suggest resting left ventricular outflow tract obstruction.   The cardiac rhythm appears irregular. The monophasic mitral inflow profile and tissue doppler profile make interpretation of diastolic function difficult.   The right ventricle is not visualized well. Limited views suggest normal right ventricular size and contractility.  Mild biatrial enlargement.  The aortic valve appears considerably sclerotic but the Doppler exam does not suggest significant aortic valve stenosis. There is no evidence of significant valvular regurgitation or stenosis by doppler exam.  The aortic root and the visualized portions of the ascending aorta appear normal in size.  No pericardial effusion is seen.  The estimated peak systolic PA pressure = 52 mmHg.  No other echocardiograms were available for comparison.    Recent labs from 08/14/2023 revealed hemoglobin 11.9 g/dL, platelet count 161W.  Potassium 4.0 mmol/L, creatinine 1.00 mg/dL.    Cardiovascular Health Factors  Vitals BP Readings from Last 3 Encounters:   08/27/23 (!) 144/82   08/09/23 (!) 150/81   09/25/22 106/62     Wt Readings from Last 3 Encounters:   08/27/23 74.1 kg (163 lb 6.4 oz)   08/09/23 79 kg (174 lb 2.6 oz)   09/25/22 87.5 kg (193 lb)     BMI Readings from Last 3 Encounters:   08/27/23 23.11 kg/m?   08/09/23 24.99 kg/m?   09/25/22 27.30 kg/m?      Smoking Social History     Tobacco Use   Smoking Status Never   Smokeless Tobacco Never      Lipid Profile Cholesterol   Date Value Ref Range Status   09/19/2022 114  Final     HDL   Date Value Ref Range Status   09/19/2022 35 (L) >40 Final     LDL   Date Value Ref Range Status   09/19/2022 60  Final     Triglycerides   Date Value Ref Range Status   09/19/2022 96  Final      Blood Sugar Hemoglobin A1C   Date Value Ref Range Status   08/29/2018 7.7 (H) 4.5 - 6.5 Final     Glucose   Date Value Ref Range Status   08/14/2023 189 (H) 70 - 105 Final   09/19/2022 101  Final   04/26/2021 134 (H) 70 - 105 Final          Problems Addressed Today  Encounter Diagnoses   Name Primary?    Cardiovascular symptoms Yes       Assessment and Plan    Mr. Bart left leg edema has markedly improved.  His left leg bleeding has resolved and there is no evidence of recurrent or ongoing bleeding in his left leg. The risks and benefits of anticoagulation therapy have been reviewed with the patient. The patient understands that anticoagulation is used to decrease thrombotic or clotting  complications associated with atrial fibrillation/flutter, such as stroke and systemic embolization which can be disabling or fatal, but can, on occasion, lead to life-threatening bleeding complications including gastrointestinal, bleeding in an extremity, internal bleeding or intracranial hemorrhage. The patient wishes to restart anticoagulation at this time. Anticoagulation options were presented to the patient which included warfarin or one of the newer direct acting oral anticoagulants and the patient wanted to start apixaban 5 mg twice daily.  I have asked him to check his Chem-7 today to make sure that his renal function remains normal and that 5 mg twice daily is the correct dose.  I discussed the risk and benefits of the Watchman left atrial appendage occlusion device with him in great detail.  I would recommend a left atrial appendage occlusion device for him because of his stroke risk associated with atrial fibrillation and his bleeding risk with the episode of recent bleeding in his left leg in August 2024.  See below:   Shared Decision-Making interaction regarding the use of anticoagulation in patient with non-valvular Atrial Fibrillation    NYHA Class:   III (marked limitation of physical activity - comfortable at rest, but less than ordinary activity causes symptoms of HF e.g., getting dressed or standing from a sitting position)    CHA2DS2-VASc Score: HTN (1), DM (1), Stroke, TIA, Thromboembolism (2), and >_ 75 (2)  CHA2DSVASc Yearly Stroke Risk: 6=9.8%    HAS-BLED score: HTN (1), Stroke (1), Bleeding History (1), and Elderly >65 (1)  HAS-BLED Yearly Risk: 4=8.7%    LAAO Indication: History of Bleeding or increased risk of bleeding    Based on their past-history it has been determined that they are poor candidates for long-term oral-anticoagulation, however, may be tolerant of short-term treatment with warfarin or other direct acting oral anticoagulants as necessary.     We have discussed their unique stroke and bleeding risk both on and off oral-anticoagulation, and the rationale for this referral for transcatheter left atrial appendage closure. An evidence-based tool (EBT) was provided to patient to ensure that the patients' health goals and preferences were covered.    A copy of the EBT can be accessed thru [facility] system under 'Left Atrial Appendage Occlusion (LAAO) Device Shared Decision-Making Tool.'    Cardiovascular risk factor modification was discussed in great detail.  Fall risk prevention was greatly emphasized.  I have asked him to return for follow-up in approximately 3 months time to follow his progress. The total time spent during this interview and exam with preparation and chart review was 60 minutes.         Current Medications (including today's revisions)   acetaminophen (TYLENOL EXTRA STRENGTH) 500 mg tablet Take one tablet by mouth as Needed for Pain. Max of 4,000 mg of acetaminophen in 24 hours.    amoxicillin (AMOXIL) 500 mg capsule Take one capsule by mouth. Take as needed for dental appt.    atorvastatin (LIPITOR) 40 mg tablet Take 1 tablet by mouth once daily    betamethasone dipropionate (BETANATE) 0.05 % topical cream Apply  topically to affected area twice daily.    clotrimazole-betamethasone (LOTRISONE) 1-0.05 % topical cream Apply  topically to affected area daily.    coenzyme Q10(+) 100 mg PO Cap Take two capsules by mouth daily.    dabigatran (PRADAXA) 150 mg capsule Take 1 capsule by mouth twice daily    empagliflozin (JARDIANCE) 25 mg tablet Take one tablet by mouth daily.    finasteride (PROSCAR) 5  mg tablet Take one tablet by mouth daily.      glimepiride (AMARYL) 4 mg tablet Take two tablets by mouth daily.      hydroCHLOROthiazide (HYDRODIURIL) 25 mg tablet Take 1 tablet by mouth once daily    losartan (COZAAR) 100 mg tablet Take 1 tablet by mouth once daily    metoprolol tartrate 75 mg tablet Take 1 tablet by mouth twice daily    Miscellaneous Medical [employer] misc [employer] - rigid AFO - subtalar arthritis and foot deformity of L foot    MULTIVITAMIN W-MINERALS/LUTEIN (CENTRUM SILVER PO) Take  by mouth daily.    pioglitazone (ACTOS) 30 mg tablet Take one tablet by mouth daily.    semaglutide (RYBELSUS) 7 mg tablet Take one tablet by mouth daily.    tamsulosin (FLOMAX) 0.4 mg capsule Take one capsule by mouth daily.

## 2023-09-02 ENCOUNTER — Encounter: Admit: 2023-09-02 | Discharge: 2023-09-02 | Payer: MEDICARE

## 2023-10-10 ENCOUNTER — Encounter: Admit: 2023-10-10 | Discharge: 2023-10-10 | Payer: MEDICARE

## 2023-10-10 DIAGNOSIS — I4891 Unspecified atrial fibrillation: Secondary | ICD-10-CM

## 2023-10-10 DIAGNOSIS — I10 Essential (primary) hypertension: Secondary | ICD-10-CM

## 2023-10-10 DIAGNOSIS — M1990 Unspecified osteoarthritis, unspecified site: Secondary | ICD-10-CM

## 2023-10-10 DIAGNOSIS — E119 Type 2 diabetes mellitus without complications: Secondary | ICD-10-CM

## 2023-10-10 DIAGNOSIS — E785 Hyperlipidemia, unspecified: Secondary | ICD-10-CM

## 2023-10-10 DIAGNOSIS — N400 Enlarged prostate without lower urinary tract symptoms: Secondary | ICD-10-CM

## 2023-10-10 DIAGNOSIS — R42 Dizziness and giddiness: Secondary | ICD-10-CM

## 2023-10-10 DIAGNOSIS — M549 Dorsalgia, unspecified: Secondary | ICD-10-CM

## 2023-10-10 DIAGNOSIS — Z9229 Personal history of other drug therapy: Secondary | ICD-10-CM

## 2023-10-10 DIAGNOSIS — I639 Cerebral infarction, unspecified: Secondary | ICD-10-CM

## 2023-11-01 ENCOUNTER — Encounter: Admit: 2023-11-01 | Discharge: 2023-11-01 | Payer: MEDICARE

## 2023-11-06 ENCOUNTER — Encounter: Admit: 2023-11-06 | Discharge: 2023-11-06 | Payer: MEDICARE

## 2023-11-06 ENCOUNTER — Ambulatory Visit: Admit: 2023-11-06 | Discharge: 2023-11-06 | Payer: MEDICARE

## 2023-11-06 DIAGNOSIS — R0989 Other specified symptoms and signs involving the circulatory and respiratory systems: Secondary | ICD-10-CM

## 2023-11-06 MED ORDER — LOSARTAN 50 MG PO TAB
50 mg | ORAL_TABLET | Freq: Every day | ORAL | 0 refills | 90.00000 days | Status: AC
Start: 2023-11-06 — End: ?

## 2023-11-06 NOTE — Progress Notes
Date of Service: 11/06/2023    Miguel Howe  is a 89 y.o. male     Referred by: Dr Arna Medici    HPI    Subjective         The patient, Miguel Howe, Boo presents for a cardiac electrophysiology consultation regarding a potential Watchman implantation. The patient has a known history of permanent atrial fibrillation (AFib) for over ten years, which was discovered after experiencing a stroke in 2012. The stroke resulted in left arm and leg weakness. The patient also underwent unsuccessful cardioversion a few years after the stroke.    The patient has a history of hypertension and diabetes. 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 In 2021, the patient underwent arthroscopic left knee surgery twice. The patient has been on Eliquis since August, having previously been on Pradaxa since 2012. The patient reports increased risk of bleeding, with blood leaking into the legs and body, causing swelling and discoloration. In August, the patient experienced hemarthrosis, with blood leaking into the knee joints.    In addition to the cardiac issues, the patient reports a recent issue with the left eye, diagnosed as retinal vein occlusion. This condition has resulted in vision loss and is currently being treated with monthly injections. The patient also reports persistent leg swelling, the cause of which remains undetermined. The patient's blood pressure has been running high recently, and there is a history of anemia noted in the patient's records. The patient is scheduled to follow up with his primary care physician in a month for further management of these conditions.                EXTREMITIES: Edema present in legs.            Assessment and Plan    Problems Addressed Today  Encounter Diagnoses   Name Primary?    Cardiovascular symptoms Yes            LABS  Hb: 11.9 g/dL (72/5366)  Hb: 14 g/dL  Y4I: 3.4%  V4Q: 5.9%    RADIOLOGY  CT scan: Small subacute chronic right basal infarct    DIAGNOSTIC  Echocardiogram: Normal left ventricular function               Permanent Atrial Fibrillation  Long-standing permanent atrial fibrillation for over ten years. Experienced a stroke in 2012 resulting in left arm and leg weakness. Currently on Eliquis since August 2024, previously on Pradaxa since 2012. Increased risk of bleeding, including hemarthrosis in August 2024, prompting consideration for Watchman implantation to reduce stroke risk while discontinuing anticoagulants. Discussed Watchman implantation procedure, including risks (less than 1% chance of complications such as infection, bleeding, heart puncture, clot formation, and device dislodgement), benefits (reduction in stroke risk, discontinuation of blood thinners), and outcomes (successful placement in about 99% of cases).  - Schedule Watchman implantation  - Order CT scan of the heart to assess left atrial appendage anatomy  - Resume Eliquis post-procedure for 45 days  - Switch to Plavix for 4 months post-procedure  - Switch to aspirin for lifelong therapy post-Plavix    Shared Decision-Making interaction regarding the use of anticoagulation in patient with non-valvular Atrial Fibrillation    NYHA Class:   II (slight limitation of physical activity, comfortable at rest, but ordinary physical activity results in symptoms of HF e.g., walking or climbing stairs)    CHA2DS2-VASc Score: HTN (1), Stroke, TIA, Thromboembolism (2), and >_ 75 (2)  CHA2DSVASc  Yearly Stroke Risk: 5=6.7%    HAS-BLED score: HTN (1), Stroke (1), Bleeding History (1), and Elderly >65 (1)  HAS-BLED Yearly Risk: 4=8.7%    LAAO Indication: History of Bleeding or increased risk of bleeding    Based on their past-history it has been determined that they are poor candidates for long-term oral-anticoagulation, however, may be tolerant of short-term treatment with warfarin or other direct acting oral anticoagulants as necessary.     We have discussed their unique stroke and bleeding risk both on and off oral-anticoagulation, and the rationale for this referral for transcatheter left atrial appendage closure. An evidence-based tool (EBT) was provided to patient to ensure that the patients' health goals and preferences were covered.    A copy of the EBT can be accessed thru [facility] system under 'Left Atrial Appendage Occlusion (LAAO) Device Shared Decision-Making Tool.'            Hypertension  Poorly controlled hypertension with blood pressure readings between 135-175 mmHg. Losartan was discontinued in August 2024 due to bleeding concerns. Currently on metoprolol. Recent increase in blood pressure possibly due to pain and medication changes.  - Restart losartan at 50 mg  - Follow up with primary care physician to adjust dosage if needed  - Avoid high sodium content beverages  - Increase water intake to 64 ounces per day    Left Eye Retinal Vein Occlusion  Recent onset of left eye retinal vein occlusion in August 2024, attributed to hypertension and diabetes. Ophthalmologist plans to administer monthly injections for three months.  - Proceed with ophthalmologist's treatment plan  - Monitor blood pressure and diabetes control closely    Diabetes Mellitus  Diabetes with recent HbA1c of 6.8%, improved from 7.7%.  - Continue current diabetes management  - Monitor blood glucose levels    General Health Maintenance  Discussed the importance of hydration and avoiding high sodium intake.  - Increase water intake to 64 ounces per day  - Avoid high sodium beverages  - Consider flavored water without sodium or potassium    Follow-up  - Schedule CT scan of the heart  - Schedule Watchman implantation  - Follow up with primary care physician on December 02, 2023  - Nurse navigator to contact for scheduling and coordination.           All results for 12-Lead ECG this visit   ECG 12-LEAD    Collection Time: 11/06/23  8:48 AM   Result Value Status    VENTRICULAR RATE 66 Incomplete    P-R INTERVAL  Incomplete    QRS DURATION 130 Incomplete    Q-T INTERVAL 428 Incomplete    QTC CALCULATION (BAZETT) 448 Incomplete    P AXIS  Incomplete    R AXIS -80 Incomplete    T AXIS -13 Incomplete    Impression    Atrial fibrillation  Right bundle branch block  Left anterior fascicular block  ** Bifascicular block **  Abnormal ECG  When compared with ECG of 27-Aug-2023 13:31,  No significant change was found        Thank you for letting us participate in the care of your patient. Please feel free to contact us if you have any questions or concerns.           Vitals:    11/06/23 0833   BP: (!) 178/123   BP Source: Arm, Left Upper   Pulse: 64   SpO2: 99%   O2 Device:  None (Room air)   PainSc: Zero   Weight: 73.4 kg (161 lb 12.8 oz)   Height: 177.8 cm (5' 10)      Body mass index is 23.22 kg/m?[name]     Past Medical History         Arthritis  Atrial fibrillation (HCC)  Back pain  BPH (benign prostatic hypertrophy)  Diabetes (HCC)  Dizziness  Dyslipidemia  HTN (hypertension)  HX: anticoagulation  Hyperlipoproteinemia  Stroke [facility])     Review of Systems   Constitutional: Negative.   HENT: Negative.     Eyes: Negative.    Cardiovascular: Negative.    Respiratory: Negative.     Endocrine: Negative.    Hematologic/Lymphatic: Negative.    Skin: Negative.    Musculoskeletal:  Positive for arthritis and joint pain.   Gastrointestinal: Negative.    Genitourinary: Negative.    Neurological: Negative.    Psychiatric/Behavioral: Negative.     Allergic/Immunologic: Negative.         Physical Exam     Patient is a moderately well-built gentleman who is comfortable at rest,  not in any distress.  Sclerae anicteric.  The oral mucosa is moist and pink.  Neck is  supple without any lymphadenopathy.  Lungs are clear to auscultation bilaterally.  Breath  sounds are normal.  Cardiac exam reveals normal S1, S2 with irregular rate and  rhythm.  No murmurs, rubs or gallops noted.  Abdomen:  Soft, nontender, nondistended.  Bowel sounds are present.  Extremities:  No cyanosis, clubbing or edema.  Peripheral  pulses are symmetric.  Skin without any rash. . No gross motor or neuro deficits.      Cardiovascular Studies           Cardiovascular Health Factors  Vitals BP Readings from Last 3 Encounters:   11/06/23 (!) 178/123   08/27/23 (!) 144/82   08/09/23 (!) 150/81     Wt Readings from Last 3 Encounters:   11/06/23 73.4 kg (161 lb 12.8 oz)   08/27/23 74.1 kg (163 lb 6.4 oz)   08/09/23 79 kg (174 lb 2.6 oz)     BMI Readings from Last 3 Encounters:   11/06/23 23.22 kg/m?   08/27/23 23.11 kg/m?   08/09/23 24.99 kg/m?      Smoking Social History     Tobacco Use   Smoking Status Never   Smokeless Tobacco Never      Lipid Profile Cholesterol   Date Value Ref Range Status   09/19/2022 114  Final     HDL   Date Value Ref Range Status   09/19/2022 35 (L) >40 Final     LDL   Date Value Ref Range Status   09/19/2022 60  Final     Triglycerides   Date Value Ref Range Status   09/19/2022 96  Final      Blood Sugar Hemoglobin A1C   Date Value Ref Range Status   08/29/2018 7.7 (H) 4.5 - 6.5 Final     Glucose   Date Value Ref Range Status   08/27/2023 144 (H) 70 - 105 Final   08/14/2023 189 (H) 70 - 105 Final   09/19/2022 101  Final          Current Medications (including today's revisions)   amoxicillin (AMOXIL) 500 mg capsule Take one capsule by mouth. Take as needed for dental appt.    apixaban (ELIQUIS) 5 mg tablet Take one tablet by mouth twice daily.    atorvastatin (LIPITOR)  40 mg tablet Take 1 tablet by mouth once daily    betamethasone dipropionate (BETANATE) 0.05 % topical cream Apply  topically to affected area twice daily.    clotrimazole-betamethasone (LOTRISONE) 1-0.05 % topical cream Apply  topically to affected area daily.    coenzyme Q10(+) 100 mg PO Cap Take two capsules by mouth daily.    empagliflozin (JARDIANCE) 25 mg tablet Take one tablet by mouth daily.    finasteride (PROSCAR) 5 mg tablet Take one tablet by mouth daily.      glimepiride (AMARYL) 4 mg tablet Take two tablets by mouth daily.      losartan (COZAAR) 50 mg tablet Take one tablet by mouth daily.    metoprolol tartrate 75 mg tablet Take 1 tablet by mouth twice daily    MULTIVITAMIN W-MINERALS/LUTEIN (CENTRUM SILVER PO) Take  by mouth daily.    semaglutide (RYBELSUS) 7 mg tablet Take one tablet by mouth daily.

## 2023-11-08 ENCOUNTER — Encounter: Admit: 2023-11-08 | Discharge: 2023-11-08 | Payer: MEDICARE

## 2023-11-08 DIAGNOSIS — I480 Paroxysmal atrial fibrillation: Secondary | ICD-10-CM

## 2023-11-11 ENCOUNTER — Encounter: Admit: 2023-11-11 | Discharge: 2023-11-11 | Payer: MEDICARE

## 2023-11-15 ENCOUNTER — Encounter: Admit: 2023-11-15 | Discharge: 2023-11-15 | Payer: MEDICARE

## 2023-12-06 ENCOUNTER — Encounter: Admit: 2023-12-06 | Discharge: 2023-12-06 | Payer: MEDICARE

## 2023-12-06 DIAGNOSIS — I480 Paroxysmal atrial fibrillation: Secondary | ICD-10-CM

## 2023-12-06 DIAGNOSIS — I10 Essential (primary) hypertension: Secondary | ICD-10-CM

## 2023-12-06 NOTE — Progress Notes
Medicare is listed as patient's primary insurance coverage.  Pre-certification is not required for hospitalizations.

## 2023-12-06 NOTE — Telephone Encounter
-----   Message from [place] R sent at 11/06/2023  9:40 AM CST -----  Regarding: Watchman  RAD saw pt in clinic today. He wants patient to be scheduled for watchman. He said he will need a CTA. Thanks!

## 2023-12-06 NOTE — Telephone Encounter
Patient's identity was verified using two patient identifiers (name an date of birth).    Called patient to schedule Watchman implant with Dr. Betti Cruz for Dr. Wallene Huh d/t availability  Patient agreed to date of service, 12/15/2024  Pre procedure appointments discussed.  Patient verbalized understanding of dates/times/locations of all appointments.  Pre procedure instructions sent via MyChart per request.

## 2023-12-07 ENCOUNTER — Encounter: Admit: 2023-12-07 | Discharge: 2023-12-07 | Payer: MEDICARE

## 2023-12-09 ENCOUNTER — Encounter: Admit: 2023-12-09 | Discharge: 2023-12-09 | Payer: MEDICARE

## 2023-12-09 ENCOUNTER — Ambulatory Visit: Admit: 2023-12-09 | Discharge: 2023-12-09 | Payer: MEDICARE

## 2023-12-09 ENCOUNTER — Ambulatory Visit: Admit: 2023-12-09 | Discharge: 2023-12-10 | Payer: MEDICARE

## 2023-12-09 ENCOUNTER — Inpatient Hospital Stay: Admit: 2023-12-09 | Discharge: 2023-12-09 | Payer: MEDICARE

## 2023-12-09 DIAGNOSIS — I4892 Unspecified atrial flutter: Secondary | ICD-10-CM

## 2023-12-09 DIAGNOSIS — I4821 Permanent atrial fibrillation: Secondary | ICD-10-CM

## 2023-12-09 DIAGNOSIS — R0989 Other specified symptoms and signs involving the circulatory and respiratory systems: Secondary | ICD-10-CM

## 2023-12-09 NOTE — Progress Notes
Date of Service: 12/09/2023    Miguel Howe is a 89 y.o. male.       HPI     Miguel Howe was seen in the office today in electrophysiology follow-up. As you may know, he is a 89 y.o. male, with past medical history including permanent atrial fibrillation, prior stroke (2012), hypertension, diabetes mellitus, history of basal cell skin cancer status post resection, and prior arthroscopic left knee surgery x 2.  He met with Dr. Wallene Howe in November 2024 for evaluation for Watchman.    He is scheduled for a Watchman device implant on 12/16/2023 with Dr. Betti Howe.  He presents to clinic today for updated H&P.    He had previously been on Pradaxa since 2012.  He was more recently on Eliquis but reported blood leaking into his legs causing swelling and discoloration.  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  He was felt to be a poor candidate for long-term anticoagulation given his increased bleeding risk and hemarthrosis.      Today in clinic he is accompanied by his wife and adult daughter.  He denies any significant change in his health recently.  He has been compliant with his Eliquis and denies any missed doses.  He is in atrial fibrillation with a ventricular rate of 71 bpm.  He denies any recent concerns for infection including fevers, chills, or bodyaches.  Of note, there were plans for a pre-procedure cardiac CTA for evaluation of his left atrial appendage but due to the timing of his procedure this will not be pursued.       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 Overall left ventricular systolic function appears normal. The estimated left ventricular ejection fraction is 60-65%.  Mild basal septal hypertrophy is noted. The Doppler recording obtained from the left ventricular outflow tract has normal velocity and profile and does not suggest resting left ventricular outflow tract obstruction.   The cardiac rhythm appears irregular. The monophasic mitral inflow profile and tissue doppler profile make interpretation of diastolic function difficult.   The right ventricle is not visualized well. Limited views suggest normal right ventricular size and contractility.  Mild biatrial enlargement.  The aortic valve appears considerably sclerotic but the Doppler exam does not suggest significant aortic valve stenosis. There is no evidence of significant valvular regurgitation or stenosis by doppler exam.  The aortic root and the visualized portions of the ascending aorta appear normal in size.  No pericardial effusion is seen.  The estimated peak systolic PA pressure = 52 mmHg.      Hyperlipoproteinemia 08/15/2010  Atrial fibrillation (HCC) 08/15/2010    Diabetes (HCC) 08/15/2010    BPH (benign prostatic hypertrophy) 08/15/2010    Dyslipidemia 08/15/2010         Review of Systems   All other systems reviewed and are negative.      Physical Exam   Vitals reviewed.  Eyes: No scleral icterus.   Cardiovascular: Normal rate and normal heart sounds. An irregular rhythm present.   Pulmonary/Chest: Effort normal and breath sounds normal.   Abdominal: Normal appearance.   Musculoskeletal:         General: Normal range of motion.      Cervical back: Normal range of motion.   Neurological: He is alert and oriented to person, place, and time.   Skin: Skin is warm and dry.   Psychiatric: Mood, judgment and thought content normal.     Cardiovascular Studies  Preliminary ECG today shows atrial fibrillation with ventricular rate of 71 bpm, QRS 134 ms right bundle branch block, and QTc 447 ms.    Cardiovascular Health Factors  Vitals BP Readings from Last 3 Encounters:   12/09/23 (!) 156/97   11/06/23 (!) 178/123   08/27/23 (!) 144/82     Wt Readings from Last 3 Encounters:   12/09/23 73 kg (161 lb)   11/06/23 73.4 kg (161 lb 12.8 oz)   08/27/23 74.1 kg (163 lb 6.4 oz)     BMI Readings from Last 3 Encounters:   12/09/23 23.10 kg/m?   11/06/23 23.22 kg/m?   08/27/23 23.11 kg/m?      Smoking Social History     Tobacco Use   Smoking Status Never   Smokeless Tobacco Never      Lipid Profile Cholesterol   Date Value Ref Range Status   09/19/2022 114  Final     HDL   Date Value Ref Range Status   09/19/2022 35 (L) >40 Final     LDL   Date Value Ref Range Status   09/19/2022 60  Final     Triglycerides   Date Value Ref Range Status   09/19/2022 96  Final      Blood Sugar Hemoglobin A1C   Date Value Ref Range Status   08/29/2018 7.7 (H) 4.5 - 6.5 Final     Glucose   Date Value Ref Range Status   08/27/2023 144 (H) 70 - 105 Final   08/14/2023 189 (H) 70 - 105 Final   09/19/2022 101  Final          Problems Addressed Today  Encounter Diagnoses   Name Primary?    Cardiovascular symptoms Yes    Paroxysmal atrial flutter (HCC)        Assessment and Plan     Miguel Howe is an 89 year old male who is scheduled for a Watchman implantation with Dr. Betti Howe on 12/16/2023.  We reviewed procedural details and Howe course today.  We reviewed his preprocedure instructions and he was provided a copy.  A preprocedure cardiac CTA for left atrial appendage anatomy evaluation was planned but due to the timing of his procedure this will not be pursued.  Risks and benefits of the procedure were previously discussed by Dr. Wallene Howe and reviewed today.  He will meet with the anesthesia team later this afternoon and have blood work drawn.  All questions were answered to his satisfaction.  He is ready to proceed as scheduled and is agreeable to do so.    Sharman Cheek, APRN-C  Current Medications (including today's revisions)   amoxicillin (AMOXIL) 500 mg capsule Take one capsule by mouth. Take as needed for dental appt.    apixaban (ELIQUIS) 5 mg tablet Take one tablet by mouth twice daily.    atorvastatin (LIPITOR) 40 mg tablet Take 1 tablet by mouth once daily    betamethasone dipropionate (BETANATE) 0.05 % topical cream Apply  topically to affected area twice daily.    clotrimazole-betamethasone (LOTRISONE) 1-0.05 % topical cream Apply  topically to affected area daily.    coenzyme Q10(+) 100 mg PO Cap Take two capsules by mouth daily.    empagliflozin (JARDIANCE) 25 mg tablet Take one tablet by mouth daily.    finasteride (PROSCAR) 5 mg tablet Take one tablet by mouth daily.      glimepiride (AMARYL) 4 mg tablet Take two tablets by mouth daily.      losartan (COZAAR) 50 mg tablet Take one tablet by mouth daily.    metoprolol tartrate 75 mg tablet Take 1 tablet by mouth twice daily    MULTIVITAMIN W-MINERALS/LUTEIN (CENTRUM SILVER PO) Take  by mouth daily.    semaglutide (RYBELSUS) 7 mg tablet Take one tablet by mouth daily.

## 2023-12-13 NOTE — H&P (View-Only)
History and Physical Update Note    Allergies:  Aggrenox [aspirin-dipyridamole]    Lab/Radiology/Other Diagnostic Tests:  Hematology:    Lab Results   Component Value Date    HGB 15.2 12/09/2023    HGB 11.9 08/14/2023    HCT 44.9 12/09/2023    HCT 37.1 08/14/2023    PLTCT 161 12/09/2023    PLTCT 239 08/14/2023    WBC 6.60 12/09/2023    WBC 6.51 08/14/2023    NEUT 61 08/23/2010    ANC 4.15 08/23/2010    ALC 1.91 08/23/2010    MONA 10 08/23/2010    AMC 0.67 08/23/2010    EOSA 1 08/23/2010    ABC 0.02 08/23/2010    MCV 94.6 12/09/2023    MCV 100.5 08/14/2023    MCH 32.0 12/09/2023    MCH 32.2 08/14/2023    MCHC 33.8 12/09/2023    MCHC 32.1 08/14/2023    MPV 10.8 12/09/2023    MPV 10.9 08/14/2023    RDW 15.0 12/09/2023    RDW 59 08/14/2023   , Coagulation:    Lab Results   Component Value Date    PTT 25.2 08/23/2010    INR 1.0 12/16/2023    INR 1.1 08/23/2010   , General Chemistry:    Lab Results   Component Value Date    NA 142 12/09/2023    NA 141 08/27/2023    K 3.9 12/09/2023    K 4.4 08/27/2023    CL 106 12/09/2023    CL 108 08/27/2023    CO2 30 12/09/2023    CO2 22.0 08/27/2023    GAP 6 12/09/2023    GAP 11 08/27/2023    BUN 25 12/09/2023    BUN 23.8 08/27/2023    CR 0.91 12/09/2023    CR 0.92 08/27/2023    GLU 177 12/09/2023    GLU 144 08/27/2023    CA 8.8 12/09/2023    CA 8.7 08/27/2023    ALBUMIN 3.1 09/19/2022    MG 1.7 12/09/2023    MG 2.0 08/26/2010    TOTBILI 0.93 09/19/2022    PO4 3.0 08/26/2010     Point of Care Testing:  (Last 24 hours):         I have examined the patient, and there are no significant changes in their condition, from the previous H&P performed on 12/09/23.    REASON FOR ADMISSION: Watchman implantation    Physical Exam:  - bilateral groin sites are without abnormal palpatory or auscultatory findings        --------------------------------------------------------------------------------------------------------------------------------------------    Parke Simmers, APRN-NP (Nurse Practitioner)  Nurse Practitioner, Acute Care  Creation Time: 12/09/23 1217  Signed  Expand All Collapse All  Date of Service: 12/09/2023     Miguel Howe is a 89 y.o. male.        HPI     Subjective  Miguel Howe was seen in the office today in electrophysiology follow-up. As you may know, he is a 89 y.o. male, with past medical history including permanent atrial fibrillation, prior stroke (2012), hypertension, diabetes mellitus, history of basal cell skin cancer status post resection, and prior arthroscopic left knee surgery x 2.  He met with Dr. Wallene Huh in November 2024 for evaluation for Watchman.     He is scheduled for a Watchman device implant on 12/16/2023 with Dr. Betti Cruz.  He presents to clinic today for updated H&P.     He had previously been on Pradaxa since  2012.  He was more recently on Eliquis but reported blood leaking into his legs causing swelling and discoloration.  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  He was felt to be a poor candidate for long-term anticoagulation given his increased bleeding risk and hemarthrosis.        Today in clinic he is accompanied by his wife and adult daughter.  He denies any significant change in his health recently.  He has been compliant with his Eliquis and denies any missed doses.  He is in atrial fibrillation with a ventricular rate of 71 bpm.  He denies any recent concerns for infection including fevers, chills, or bodyaches.  Of note, there were plans for a pre-procedure cardiac CTA for evaluation of his left atrial appendage but due to the timing of his procedure this will not be pursued.               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 Overall left ventricular systolic function appears normal. The estimated left ventricular ejection fraction is 60-65%.  Mild basal septal hypertrophy is noted. The Doppler recording obtained from the left ventricular outflow tract has normal velocity and profile and does not suggest resting left ventricular outflow tract obstruction.   The cardiac rhythm appears irregular. The monophasic mitral inflow profile and tissue doppler profile make interpretation of diastolic function difficult.   The right ventricle is not visualized well. Limited views suggest normal right ventricular size and contractility.  Mild biatrial enlargement.  The aortic valve appears considerably sclerotic but the Doppler exam does not suggest significant aortic valve stenosis. There is no evidence of significant valvular regurgitation or stenosis by doppler exam.  The aortic root and the visualized portions of the ascending aorta appear normal in size.  No pericardial effusion is seen.  The estimated peak systolic PA pressure = 52 mmHg.       Hyperlipoproteinemia 08/15/2010    Atrial fibrillation (HCC) 08/15/2010    Diabetes (HCC) 08/15/2010    BPH (benign prostatic hypertrophy) 08/15/2010    Dyslipidemia 08/15/2010            Review of Systems   All other systems reviewed and are negative.        Physical Exam   Vitals reviewed.  Eyes: No scleral icterus.   Cardiovascular: Normal rate and normal heart sounds. An irregular rhythm present.   Pulmonary/Chest: Effort normal and breath sounds normal.   Abdominal: Normal appearance. Musculoskeletal:         General: Normal range of motion.  Cervical back: Normal range of motion.   Neurological: He is alert and oriented to person, place, and time.   Skin: Skin is warm and dry.   Psychiatric: Mood, judgment and thought content normal.      Cardiovascular Studies  Preliminary ECG today shows atrial fibrillation with ventricular rate of 71 bpm, QRS 134 ms right bundle branch block, and QTc 447 ms.     Cardiovascular Health Factors  Vitals     BP Readings from Last 3 Encounters:   12/09/23 (!) 156/97   11/06/23 (!) 178/123   08/27/23 (!) 144/82          Wt Readings from Last 3 Encounters:   12/09/23 73 kg (161 lb)   11/06/23 73.4 kg (161 lb 12.8 oz)   08/27/23 74.1 kg (163 lb 6.4 oz)          BMI Readings from Last 3 Encounters:   12/09/23 23.10 kg/m?   11/06/23 23.22 kg/m?   08/27/23 23.11 kg/m?      Smoking Tobacco Use History   Social History          Tobacco Use   Smoking Status Never   Smokeless Tobacco Never         Lipid Profile       Cholesterol   Date Value Ref Range Status   09/19/2022 114   Final            HDL   Date Value Ref Range Status   09/19/2022 35 (L) >40 Final            LDL   Date Value Ref Range Status   09/19/2022 60   Final            Triglycerides   Date Value Ref Range Status   09/19/2022 96   Final      Blood Sugar       Hemoglobin A1C   Date Value Ref Range Status   08/29/2018 7.7 (H) 4.5 - 6.5 Final            Glucose   Date Value Ref Range Status   08/27/2023 144 (H) 70 - 105 Final   08/14/2023 189 (H) 70 - 105 Final   09/19/2022 101   Final            Problems Addressed Today       Encounter Diagnoses   Name Primary?    Cardiovascular symptoms Yes    Paroxysmal atrial flutter [facility])           Assessment and Plan     Assessment & Plan  Miguel Howe is an 89 year old male who is scheduled for a Watchman implantation with Dr. Betti Cruz on 12/16/2023.  We reviewed procedural details and hospital course today.  We reviewed his preprocedure instructions and he was provided a copy.  A preprocedure cardiac CTA for left atrial appendage anatomy evaluation was planned but due to the timing of his procedure this will not be pursued.  Risks and benefits of the procedure were previously discussed by Dr. Wallene Huh and reviewed today.  He will meet with the anesthesia team later this afternoon and have blood work drawn.  All questions were answered to his satisfaction.  He is ready to proceed as scheduled and is agreeable to do so.     Sharman Cheek, APRN-C            Current Medications (including today's revisions)   amoxicillin (AMOXIL) 500 mg capsule Take one  capsule by mouth. Take as needed for dental appt.    apixaban (ELIQUIS) 5 mg tablet Take one tablet by mouth twice daily.    atorvastatin (LIPITOR) 40 mg tablet Take 1 tablet by mouth once daily    betamethasone dipropionate (BETANATE) 0.05 % topical cream Apply  topically to affected area twice daily.    clotrimazole-betamethasone (LOTRISONE) 1-0.05 % topical cream Apply  topically to affected area daily.    coenzyme Q10(+) 100 mg PO Cap Take two capsules by mouth daily.    empagliflozin (JARDIANCE) 25 mg tablet Take one tablet by mouth daily.    finasteride (PROSCAR) 5 mg tablet Take one tablet by mouth daily.      glimepiride (AMARYL) 4 mg tablet Take two tablets by mouth daily.      losartan (COZAAR) 50 mg tablet Take one tablet by mouth daily.    metoprolol tartrate 75 mg tablet Take 1 tablet by mouth twice daily    MULTIVITAMIN W-MINERALS/LUTEIN (CENTRUM SILVER PO) Take  by mouth daily.    semaglutide (RYBELSUS) 7 mg tablet Take one tablet by mouth daily.

## 2023-12-16 ENCOUNTER — Encounter: Admit: 2023-12-16 | Discharge: 2023-12-16 | Payer: MEDICARE

## 2023-12-16 ENCOUNTER — Inpatient Hospital Stay: Admit: 2023-12-16 | Discharge: 2023-12-16 | Payer: MEDICARE

## 2023-12-16 ENCOUNTER — Inpatient Hospital Stay
Admit: 2023-12-16 | Discharge: 2023-12-17 | Disposition: A | Discharge status: Home / Self Care | Payer: MEDICARE | Attending: Cardiovascular Disease | Admitting: Cardiovascular Disease

## 2023-12-16 LAB — POC GLUCOSE
~~LOC~~ BKR POC GLUCOSE: 95 mg/dL (ref 70–100)
~~LOC~~ BKR POC GLUCOSE: 196 mg/dL — ABNORMAL HIGH (ref 70–100)
~~LOC~~ BKR POC GLUCOSE: 214 mg/dL — ABNORMAL HIGH (ref 70–100)

## 2023-12-16 LAB — POC ACTIVATED CLOTTING TIME (ISTAT): ~~LOC~~ BKR POCT ACT ISTAT: 256 s

## 2023-12-16 LAB — POC INR
UKH BKR PROTIME POC: 12
~~LOC~~ BKR POC INR: 1 — ABNORMAL LOW (ref 2.0–3.0)

## 2023-12-16 MED ORDER — DOCUSATE SODIUM 100 MG PO CAP
100 mg | Freq: Every day | ORAL | 0 refills | Status: DC | PRN
Start: 2023-12-16 — End: 2023-12-17

## 2023-12-16 MED ORDER — CLOPIDOGREL 75 MG PO TAB
75 mg | Freq: Every day | ORAL | 0 refills | Status: DC
Start: 2023-12-16 — End: 2023-12-17
  Administered 2023-12-17: 15:00:00 75 mg via ORAL

## 2023-12-16 MED ORDER — FENTANYL CITRATE (PF) 50 MCG/ML IJ SOLN
INTRAVENOUS | 0 refills | Status: DC
Start: 2023-12-16 — End: 2023-12-16

## 2023-12-16 MED ORDER — CLOPIDOGREL 75 MG PO TAB
75 mg | Freq: Once | ORAL | 0 refills | Status: DC
Start: 2023-12-16 — End: 2023-12-16

## 2023-12-16 MED ORDER — ATORVASTATIN 40 MG PO TAB
40 mg | Freq: Every evening | ORAL | 0 refills | Status: DC
Start: 2023-12-16 — End: 2023-12-17
  Administered 2023-12-17: 02:00:00 40 mg via ORAL

## 2023-12-16 MED ORDER — DEXAMETHASONE SODIUM PHOSPHATE 4 MG/ML IJ SOLN
INTRAVENOUS | 0 refills | Status: DC
Start: 2023-12-16 — End: 2023-12-16

## 2023-12-16 MED ORDER — APIXABAN 5 MG PO TAB
5 mg | Freq: Two times a day (BID) | ORAL | 0 refills | Status: DC
Start: 2023-12-16 — End: 2023-12-17
  Administered 2023-12-16 – 2023-12-17 (×2): 5 mg via ORAL

## 2023-12-16 MED ORDER — ARTIFICIAL TEARS (PF) SINGLE DOSE DROPS GROUP
OPHTHALMIC | 0 refills | Status: DC
Start: 2023-12-16 — End: 2023-12-16

## 2023-12-16 MED ORDER — HYDROCODONE-ACETAMINOPHEN 5-325 MG PO TAB
1-2 | ORAL | 0 refills | Status: DC | PRN
Start: 2023-12-16 — End: 2023-12-17

## 2023-12-16 MED ORDER — DEXTROSE 50 % IN WATER (D50W) IV SYRG
12.5-25 g | INTRAVENOUS | 0 refills | Status: DC | PRN
Start: 2023-12-16 — End: 2023-12-17

## 2023-12-16 MED ORDER — SODIUM CHLORIDE 0.9% IV SOLP
INTRAVENOUS | 0 refills | Status: AC
Start: 2023-12-16 — End: ?
  Administered 2023-12-16: 15:00:00 1000.0000 mL via INTRAVENOUS

## 2023-12-16 MED ORDER — ALUMINUM-MAGNESIUM HYDROXIDE 200-200 MG/5 ML PO SUSP
30 mL | ORAL | 0 refills | Status: DC | PRN
Start: 2023-12-16 — End: 2023-12-17

## 2023-12-16 MED ORDER — METOPROLOL TARTRATE 25 MG PO TAB
75 mg | Freq: Two times a day (BID) | ORAL | 0 refills | Status: DC
Start: 2023-12-16 — End: 2023-12-16

## 2023-12-16 MED ORDER — IODIXANOL 320 MG IODINE/ML IV SOLN
0 refills | Status: DC
Start: 2023-12-16 — End: 2023-12-16

## 2023-12-16 MED ORDER — HEPARIN (PORCINE) 1,000 UNIT/ML IJ SOLN
INTRAVENOUS | 0 refills | Status: DC
Start: 2023-12-16 — End: 2023-12-16

## 2023-12-16 MED ORDER — CEFAZOLIN 2 GRAM IV SOLR
2 g | Freq: Once | INTRAVENOUS | 0 refills | Status: AC
Start: 2023-12-16 — End: ?

## 2023-12-16 MED ORDER — LIDOCAINE HCL 2 % MM JELP
Freq: Once | TOPICAL | 0 refills | Status: AC
Start: 2023-12-16 — End: ?

## 2023-12-16 MED ORDER — INSULIN ASPART 100 UNIT/ML SC FLEXPEN
0-6 [IU] | Freq: Before meals | SUBCUTANEOUS | 0 refills | Status: DC
Start: 2023-12-16 — End: 2023-12-17
  Administered 2023-12-16: 23:00:00 1 [IU] via SUBCUTANEOUS

## 2023-12-16 MED ORDER — LOSARTAN 50 MG PO TAB
50 mg | Freq: Every evening | ORAL | 0 refills | Status: DC
Start: 2023-12-16 — End: 2023-12-16

## 2023-12-16 MED ORDER — CLOPIDOGREL 75 MG PO TAB
75 mg | ORAL_TABLET | Freq: Every day | ORAL | 1 refills | Status: CN
Start: 2023-12-16 — End: ?

## 2023-12-16 MED ORDER — ONDANSETRON HCL (PF) 4 MG/2 ML IJ SOLN
INTRAVENOUS | 0 refills | Status: DC
Start: 2023-12-16 — End: 2023-12-16

## 2023-12-16 MED ORDER — LIDOCAINE (PF) 200 MG/10 ML (2 %) IJ SYRG
INTRAVENOUS | 0 refills | Status: DC
Start: 2023-12-16 — End: 2023-12-16

## 2023-12-16 MED ORDER — BUPIVACAINE (PF) 0.25 % (2.5 MG/ML) IJ SOLN
0 refills | Status: DC
Start: 2023-12-16 — End: 2023-12-16

## 2023-12-16 MED ORDER — EUCALYPTUS-MENTHOL MM LOZG
1 | ORAL | 0 refills | Status: DC | PRN
Start: 2023-12-16 — End: 2023-12-17
  Administered 2023-12-16: 18:00:00 1 via ORAL

## 2023-12-16 MED ORDER — TEMAZEPAM 15 MG PO CAP
15 mg | Freq: Every evening | ORAL | 0 refills | Status: DC | PRN
Start: 2023-12-16 — End: 2023-12-17

## 2023-12-16 MED ORDER — ACETAMINOPHEN 325 MG PO TAB
650 mg | ORAL | 0 refills | Status: DC | PRN
Start: 2023-12-16 — End: 2023-12-17

## 2023-12-16 MED ORDER — HEPARIN (PORCINE) 2,000 UNIT/NS 1000 ML SOLP (EP)(TRANSEPTAL SHEATH)
0 refills | Status: DC
Start: 2023-12-16 — End: 2023-12-16

## 2023-12-16 MED ORDER — LIDOCAINE (PF) 10 MG/ML (1 %) IJ SOLN
.2 mL | INTRAMUSCULAR | 0 refills | Status: DC | PRN
Start: 2023-12-16 — End: 2023-12-17

## 2023-12-16 MED ORDER — FINASTERIDE 5 MG PO TAB
5 mg | Freq: Every evening | ORAL | 0 refills | Status: DC
Start: 2023-12-16 — End: 2023-12-17
  Administered 2023-12-17: 02:00:00 5 mg via ORAL

## 2023-12-16 MED ORDER — METOPROLOL TARTRATE 25 MG PO TAB
75 mg | Freq: Two times a day (BID) | ORAL | 0 refills | Status: DC
Start: 2023-12-16 — End: 2023-12-17
  Administered 2023-12-16 – 2023-12-17 (×3): 75 mg via ORAL

## 2023-12-16 MED ORDER — SUGAMMADEX 100 MG/ML IV SOLN
INTRAVENOUS | 0 refills | Status: DC
Start: 2023-12-16 — End: 2023-12-16

## 2023-12-16 MED ORDER — LOSARTAN 50 MG PO TAB
50 mg | Freq: Every evening | ORAL | 0 refills | Status: DC
Start: 2023-12-16 — End: 2023-12-17
  Administered 2023-12-16: 23:00:00 50 mg via ORAL

## 2023-12-16 MED ORDER — ROCURONIUM 10 MG/ML IV SOLN
INTRAVENOUS | 0 refills | Status: DC
Start: 2023-12-16 — End: 2023-12-16

## 2023-12-16 MED ORDER — CLOPIDOGREL 75 MG PO TAB
75 mg | Freq: Once | ORAL | 0 refills | Status: CP
Start: 2023-12-16 — End: ?
  Administered 2023-12-16: 14:00:00 75 mg via ORAL

## 2023-12-16 MED ORDER — FENTANYL CITRATE (PF) 50 MCG/ML IJ SOLN
12.5 ug | INTRAVENOUS | 0 refills | Status: DC | PRN
Start: 2023-12-16 — End: 2023-12-17

## 2023-12-16 MED ORDER — CEFAZOLIN 2 GRAM IV SOLR
INTRAVENOUS | 0 refills | Status: DC
Start: 2023-12-16 — End: 2023-12-16

## 2023-12-16 MED ORDER — PROPOFOL INJ 10 MG/ML IV VIAL
INTRAVENOUS | 0 refills | Status: DC
Start: 2023-12-16 — End: 2023-12-16

## 2023-12-16 MED ORDER — EMPAGLIFLOZIN 25 MG PO TAB
25 mg | Freq: Every day | ORAL | 0 refills | Status: DC
Start: 2023-12-16 — End: 2023-12-17
  Administered 2023-12-17: 15:00:00 25 mg via ORAL

## 2023-12-16 MED ORDER — PHENYLEPHRINE HCL IN 0.9% NACL 1MG/10ML INFUSION (AN)(OSM)
INTRAVENOUS | 0 refills | Status: DC
Start: 2023-12-16 — End: 2023-12-16
  Administered 2023-12-16: 16:00:00 .3 ug/kg/min via INTRAVENOUS

## 2023-12-16 NOTE — Discharge Instructions - Pharmacy
Discharge Summary      Name: Miguel Howe  Medical Record Number: [id number]        Account Number:  [id number]  Date Of Birth:  [birth year]                         Age:  89 y.o.  Admit date:  12/16/2023                     Discharge date: 12/17/2023      Discharge Attending:  Dr. Leeann Must    Discharge Summary Completed By: Parke Simmers, APRN-NP    Service: Cardiology-EP    Reason for hospitalization:  Paroxysmal atrial fibrillation (HCC) [I48.0]  PAF (paroxysmal atrial fibrillation) (HCC) [I48.0]    Primary Discharge Diagnosis:   PAF (paroxysmal atrial fibrillation) [facility])    Hospital Diagnoses:  Hospital Problems        Active Problems    * (Principal) PAF (paroxysmal atrial fibrillation) (HCC)    Diabetes (HCC)    HTN (hypertension)    Presence of Watchman left atrial appendage closure device     Present on Admission:   PAF (paroxysmal atrial fibrillation) (HCC)   HTN (hypertension)   Diabetes (HCC)        Significant Past Medical History        Arthritis  Atrial fibrillation (HCC)  Back pain  BPH (benign prostatic hypertrophy)  Diabetes (HCC)  Dizziness  Dyslipidemia  HTN (hypertension)  HX: anticoagulation  Hyperlipoproteinemia  Stroke [facility])    Allergies   Aggrenox [aspirin-dipyridamole]    Brief Hospital Course   The patient was admitted and the following issues were addressed during this hospitalization: (with pertinent details including admission exam/imaging/labs).  Miguel Howe is a 89 y.o. male with a history of permanent atrial fibrillation, prior stroke (2012), hypertension, diabetes mellitus, history of basal cell skin cancer status post resection, and prior arthroscopic left knee surgery x 2. He was felt to be a poor candidate for long-term anticoagulation due to hemarthrosis in his knees and reported blood leakage into his legs.      He was admitted 12/16/2023 to undergo Watchman implantation with Dr. Betti Cruz. Pre-procedure transesophageal echocardiogram was negative for thrombus and pt underwent successful implantation of the Watchman device.  He tolerated procedure well and was monitored overnight per protocol. He was resumed on all prior to admission medications including Eliquis with the addition of Plavix 75mg  daily. Limited echocardiogram on POD 1 revealed no pericardial effusion. CXR revealed presence of Watchman device.      Plan to continue anticoagulation plus Plavix 75mg  daily daily and obtain 51-month post-implant TEE.  If TEE shows well-seated device with minimal peri-device leak and no device related thrombus then would plan to discontinue anticoagulation and continue Plavix 75mg  daily indefinitely (given his anaphylactic allergy to Aggrenox which contains ASA).      SBE prophylaxis information was provided for patient in discharge instructions.    He is stable for discharge. A follow-up appt plus transesophageal echocardiogram in approximately 3 months will be arranged.  Upon discharge the patient and family were given post procedure instructions/restrictions as well as written instructions (see Discharge instructions).      Items Needing Follow Up   Pending items or areas that need to be addressed at follow up: as above    Pending Labs and Follow Up Radiology    Pending labs and/or radiology review at this time of discharge  are listed below: Please note- any labs with collected status will not have a result; if this area is blank, there are no items for review.   Pending Labs       Order Current Status    POC GLUCOSE In process    POC GLUCOSE In process    POC GLUCOSE In process            Medications      Medication List      START taking these medications     clopiDOGreL 75 mg tablet; Commonly known as: PLAVIX; Dose: 75 mg; Take   one tablet by mouth daily. Indications: s/p Watchman; For: s/p Watchman;   Quantity: 90 tablet; Refills: 1     CHANGE how you take these medications     atorvastatin 40 mg tablet; Commonly known as: LIPITOR; Take 1 tablet by   mouth once daily; Quantity: 90 tablet; Refills: 3; What changed: when to   take this   losartan 50 mg tablet; Commonly known as: COZAAR; Dose: 50 mg; Doctor's   comments: we are initiating this and further refills will go to PCP.; Take   one tablet by mouth daily.; Quantity: 90 tablet; Refills: 0; What changed:   when to take this     CONTINUE taking these medications     acetaminophen SR 650 mg tablet; Commonly known as: TYLENOL 8 HOUR; Dose:   650 mg; Refills: 0   amoxicillin 500 mg capsule; Commonly known as: AMOXIL; Dose: 2,000 mg;   Refills: 0   apixaban 5 mg tablet; Commonly known as: ELIQUIS; Dose: 5 mg; Take one   tablet by mouth twice daily.; Quantity: 180 tablet; Refills: 3   betamethasone dipropionate 0.05 % topical cream; Commonly known as:   BETANATE; Refills: 0   CENTRUM SILVER PO; Dose: 1 tablet; Refills: 0   clotrimazole-betamethasone 1-0.05 % topical cream; Commonly known as:   LOTRISONE; Refills: 0   coenzyme Q10 200 mg capsule; Dose: 200 mg; Refills: 0   empagliflozin 25 mg tablet; Commonly known as: JARDIANCE; Dose: 25 mg;   Refills: 0   finasteride 5 mg tablet; Commonly known as: PROSCAR; Dose: 5 mg;   Refills: 0   glimepiride 4 mg tablet; Commonly known as: AMARYL; Dose: 8 mg; Refills:   0   metoprolol tartrate 75 mg tablet; Take 1 tablet by mouth twice daily;   Quantity: 180 tablet; Refills: 3   semaglutide 7 mg tablet; Commonly known as: RYBELSUS; Dose: 7 mg;   Refills: 0       Return Appointments and Scheduled Appointments     Scheduled appointments:      Mar 12, 2024 9:00 AM  Office visit with Bruna Potter, APRN-NP  Cardiovascular Medicine: [facility] (CVM Exam) [address]  [address], [address]  [place] [address]  [phone]     Mar 12, 2024 10:30 AM  Transesophageal Echo (TEE) w Sedation with [facility] TEE/CARDIOVERSION 2  Cardiovascular Medicine: [facility] (CVM Procedural) [address]  [address], [address]  [place] [address]  [phone]     Mar 17, 2024 1:00 PM  Office visit with Hester Mates, MD  Cardiovascular Medicine: [facility] (CVM Exam) [address]  [address], [address]  [place] [address]  [phone]     Jun 08, 2024 10:30 AM  Office visit with Jen Mow, MD  Cardiovascular Medicine: [facility] (CVM Exam) [id number]  [place] [address]  [phone]            Consults, Procedures, Diagnostics, Micro, Pathology   Consults: Anesthesiology  Surgical Procedures & Dates: 12/16/2023 Watchman device implantation  Significant Diagnostic Studies, Micro and Procedures:  CXR, limited echo  Significant Pathology: none                                   Discharge Disposition, Condition   Patient Disposition: Home or Self Care [01]  Condition at Discharge: Stable    Code Status   Full Code    Patient Instructions     Activity       Procedure Specific Activity   As directed      - If you were driving prior to your procedure, you may resume driving after 2 days  - Resume your normal activity in 3 days  - NO lifting greater than 15 pounds or strenuous activity for 1 week.  However, be especially cautious for the first 2 weeks post procedure  - NO sexual activity for 1 week  - It is very important, as you recover that you gradually increase your activity each day.  If you are having discomfort or increased pain, please notify us.          Diet       Cardiac Diet   As directed      Limiting unhealthy fats and cholesterol is the most important step you can take in reducing your risk for cardiovascular disease.  Unhealthy fats include saturated and trans fats.  Monitor your sodium and cholesterol intake.  Restrict your sodium to 2g (grams) or 2000mg  (milligrams) daily, and your cholesterol to 200mg  daily.    If you have questions regarding your diet at home, you may contact a dietitian at [phone].          Wounds/Lines/Drains       Incision Care   As directed      - You may shower after discharge  - Keep site open to air unless otherwise instructed  - Be sure your hands are clean when touching near the site  - Inspect site daily  - DO NOT soak incision in water  - NO tub baths, hot tubs, or swimming for 1 week  - AVOID using deodorants, powders, creams, lotions, etc. on your puncture site  - Call if there is an increase in pain, swelling, or redness             Discharge education provided to patient.    Additional Orders: Case Management, Supplies, Home Health     Home Health/DME       None              Signed:  Parke Simmers, APRN-NP  12/17/2023      cc:  Primary Care Physician:  Wilford Grist   Verified    Referring physicians:  Self, Referral   Additional provider(s):        Did we miss something? If additional records are needed, please fax a request on office letterhead to [phone]. Please include the patient's name, date of birth, fax number and type of information needed. Additional request can be made by email at [email or web address]. For general questions of information about electronic records sharing, call [phone].

## 2023-12-16 NOTE — Progress Notes
 Patient arrived on unit via ambulation accompanied by family. Patient transferred to the bed without assistance. Frailty score equals 4  Assessment completed, refer to flowsheet for details. Orders released, reviewed, and implemented as appropriate. Oriented to surroundings, call light within reach. Plan of care reviewed.  Will continue to monitor and assess.

## 2023-12-17 ENCOUNTER — Inpatient Hospital Stay: Admit: 2023-12-17 | Discharge: 2023-12-17 | Payer: MEDICARE

## 2023-12-17 ENCOUNTER — Encounter: Admit: 2023-12-17 | Discharge: 2023-12-17 | Payer: MEDICARE

## 2023-12-17 DIAGNOSIS — Z8673 Personal history of transient ischemic attack (TIA), and cerebral infarction without residual deficits: Secondary | ICD-10-CM

## 2023-12-17 DIAGNOSIS — H348192 Central retinal vein occlusion, unspecified eye, stable: Secondary | ICD-10-CM

## 2023-12-17 DIAGNOSIS — E785 Hyperlipidemia, unspecified: Secondary | ICD-10-CM

## 2023-12-17 DIAGNOSIS — E119 Type 2 diabetes mellitus without complications: Secondary | ICD-10-CM

## 2023-12-17 DIAGNOSIS — I4892 Unspecified atrial flutter: Secondary | ICD-10-CM

## 2023-12-17 DIAGNOSIS — Z006 Encounter for examination for normal comparison and control in clinical research program: Secondary | ICD-10-CM

## 2023-12-17 DIAGNOSIS — Z7902 Long term (current) use of antithrombotics/antiplatelets: Secondary | ICD-10-CM

## 2023-12-17 LAB — ECG 12-LEAD
Q-T INTERVAL: 430 ms
QRS DURATION: 130 ms
QTC CALCULATION (BAZETT): 464 ms
R AXIS: -82 degrees
T AXIS: -20 degrees
VENTRICULAR RATE: 70 {beats}/min

## 2023-12-17 LAB — LIMITED ECHO
BSA: 1.9 m2
RA PRESSURE: 15

## 2023-12-17 LAB — POC GLUCOSE: ~~LOC~~ BKR POC GLUCOSE: 165 mg/dL — ABNORMAL HIGH (ref 70–100)

## 2023-12-17 MED ORDER — CLOPIDOGREL 75 MG PO TAB
75 mg | ORAL_TABLET | Freq: Every day | ORAL | 1 refills | 90.00000 days | Status: AC
Start: 2023-12-17 — End: ?

## 2023-12-17 NOTE — Progress Notes
Miguel Howe discharged on 12/17/2023  Patient discharged to home with all belongings.  Discharge instructions, med reconciliation and home wound care instructions given and explained to patient and family both verbally and written.  Accompanied by staff.  No complaints of pain or discomfort. Right femoral puncture site remains clean, dry, and intact with no evidence of a hematoma or bleeding after ambulation.  Patient escorted to lobby via wheelchair.  Patient to follow up with [facility] [place] [facility]) or on-call physician with any additional questions or concerns.  All contact numbers provided.  Patient and family acceptant of DC instuctions and report understanding to all information.     IV catheter removed, tip intact     Hurshel Party, RN

## 2023-12-17 NOTE — Progress Notes
EP Note:   s/p Left atrial appendage occlusion with 27 mm Watchman FLX PRO device    Patient awake and alert this am without complaints.  Ambulated halls post-procedurally without difficulty.    Pt denies chest pain or shortness of breath.     Tele reviewed: AF 60s    Labs reviewed: stable.    Imaging reports: limited echocardiogram without evidence of pericardial effusion.   CXR revealed presence of Watchman device.     Right groin suture removed and site free of bleeding, hematoma, or bruit.      The patient is stable for discharge to home at this time.  Please see discharge summary for full details.      Sharman Cheek, APRN-C (pgr [phone])  Heart Rhythm Management (pgr [phone])

## 2023-12-20 ENCOUNTER — Encounter: Admit: 2023-12-20 | Discharge: 2023-12-20 | Payer: MEDICARE

## 2023-12-26 ENCOUNTER — Encounter: Admit: 2023-12-26 | Discharge: 2023-12-26 | Payer: MEDICARE

## 2023-12-26 NOTE — Patient Instructions
3 month post Watchman office visit and TEE (transesophageal echocardiogram)   03/12/2024 at 09:00 am   Office Visit with Richardean Chimera APRN at the [facility].    Medical Office [place], [address], [address] [place], [place]  [address]      03/12/2024 at 09:30 am     (arrival time) TEE (Transesophageal Echocardiogram)  [address], [address]  [place], [place] [address]    TEE Instructions:  Nothing to eat or drink after midnight (including gum, mints, smokeless tobacco, communion).  You must have a designated person who will be driving you home and staying with you after the procedure.  Please take all prescribed medications the morning of your procedure with a sip of water unless otherwise instructed by your nurse.  HOLD any weekly diabetic medications (SEMAGLUTIDE) for at least 7 days prior to procedure.  DO NOT take after 03/04/2024.  HOLD your JARDIANCE for 3 days prior to procedure.  DO NOT take after 03/09/2024.  HOLD all vitamin supplements and oral diabetic medications (GLIMEPIRIDE) the morning of your procedure.    If you have any questions, please call the Procedure RNs office at [phone]     6 month post Watchman office visit:  06/08/2024 at 10:30 am Office Visit with Dr. Wallene Huh at the [facility]       Please let me know if you have any questions or concerns.    Thanks so much,  Tempie Hoist, BSN, RN, [employer]- Nurse Navigator   [facility] [place] [facility]  Cardiac Navigation  Phone: [phone]- [email or web address]  [address] [place], [place], [place] [address]

## 2024-01-08 ENCOUNTER — Encounter: Admit: 2024-01-08 | Discharge: 2024-01-08 | Payer: MEDICARE

## 2024-01-09 ENCOUNTER — Encounter: Admit: 2024-01-09 | Discharge: 2024-01-09 | Payer: MEDICARE

## 2024-01-09 ENCOUNTER — Ambulatory Visit: Admit: 2024-01-09 | Discharge: 2024-01-09 | Payer: MEDICARE

## 2024-01-09 DIAGNOSIS — R0989 Other specified symptoms and signs involving the circulatory and respiratory systems: Secondary | ICD-10-CM

## 2024-01-09 DIAGNOSIS — I480 Paroxysmal atrial fibrillation: Secondary | ICD-10-CM

## 2024-01-09 MED ORDER — DILTIAZEM HCL 120 MG PO CP24
120 mg | ORAL_CAPSULE | Freq: Every day | ORAL | 1 refills | 90.00000 days | Status: AC
Start: 2024-01-09 — End: ?

## 2024-01-09 NOTE — Patient Instructions
Thank you for visiting our office today.    We would like to make the following medication adjustments:      Cardizem 120mg  daily    ** Blood pressures and heart rates over the next month**       Otherwise continue the same medications as you have been doing.          We will be pursuing the following tests after your appointment today:       Orders Placed This Encounter    ECG 12-LEAD    dilTIAZem CD (CARDIZEM CD) 120 mg capsule         We will plan to see you back in 6 months.  Please call us in the meantime with any questions or concerns.        Please allow 5-7 business days for our providers to review your results. All normal results will go to MyChart. If you do not have Mychart, it is strongly recommended to get this so you can easily view all your results. If you do not have mychart, we will attempt to call you once with normal lab and testing results. If we cannot reach you by phone with normal results, we will send you a letter.  If you have not heard the results of your testing after one week please give [place] a call.       Your Cardiovascular Medicine [place] Team Brett Canales, Pilar Jarvis, Shawna Orleans, and [place])  phone number is [phone].

## 2024-01-09 NOTE — Progress Notes
Date of Service: 01/09/2024    Miguel Howe is a 89 y.o. male.       HPI   [name] is followed for permanent atrial fibrillation.  He was having difficulty with bleeding in his left leg and underwent placement of a Watchman left atrial appendage occlusion device on December 16, 2023 with excellent results.  He is now continuing his anticoagulation/antiplatelet protocol as directed.  His blood pressure and heart rate have been running higher over the past 2 to 3 months.  He tracks it nearly daily.  His exercise tolerance has been significantly limited in recent years by his left ankle arthritis.  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  Currently he reports no angina, congestive symptoms, palpitations, sensation of sustained forceful heart pounding, lightheadedness or syncope. The patient reports no myalgias, new bleeding abnormalities, or strokelike symptoms.  He has been tolerating his current medications without difficulty.      Historically, [name] presented to medical attention on August 08, 2010 when he noticed unsteadiness and dizziness while playing golf. He reports that the first 9 holes of golf that he played were unremarkable, and that he became unsteady when he started playing the back 9. At the time he believes that he may have had some focal weakness in his left leg and some numbness in his left leg as well. This persisted only for 10-15 minutes and he believes that he did not mention it in the Emergency Department because he has intermittent sciatica and lower back pain and attributed the abnormality to his chronic back disorder. He stopped playing golf and went to the Emergency Department and was seen in the Emergency Department at approximately 12:45 that afternoon. He was noted to have supraventricular tachycardia with a rate of 168 beats per minute. The Emergency Department note indicated that his supraventricular tachycardia converted spontaneously with an involuntary Valsalva that occurred while an IV was being placed. [name] reported no other focal neurologic abnormalities. He reported no change in vision or difficulty with speech. 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 When I saw [name] on 08/15/10 his BP was elevated and he was started on losartan 50 mg daily. His BP was still elevated on follow-up and his losartan was increased to 100 mg daily. On 08/23/10 he reported intermittent tingling involving his left hand and he was hospitalized at [facility] for stroke evaluation. Prior to discharge on 08/26/10 he was noted to have paroxysmal atrial flutter and he was started on Pradaxa. [name] reported some transient hematuria in October 2011. He reports that he was seen by his urologist and his hematuria resolved. When I saw [name] on 03/16/11 I noticed that his BP had been lower and he reported that he felt somewhat lethargic and orthostatic when his BP was lower. His Maxzide was stopped. Also his blood sugar was markedly elevated at 555 mg/dl. His metformin was increased and he markedly decreased his carbohydrate intake.   While [name] is unclear clinically whether he has had a stroke, a CT head scan from 08/24/10 showed MILD INVOLUTIONAL CHANGES AND SMALL SUBACUTE TO CHRONIC RIGHT BASAL GANGLIA LACUNAR INFARCT. In addition, the neurology discharge note from 08/27/10  lists subacute to chronic stroke as a discharge diagnosis. This along with age, hypertension and diabetes would yield a CHA2DS2-VASc score of 6. 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  When he was seen on 10/06/15 he was noted to be in atrial fibrillation. Treatment alternatives were presented to the patient and he wanted to proceed with cardioversion. Cardioversion was attempted on 10/10/15 but was unsuccessful.  He has selected a strategy of heart rate control and anticoagulation and does not want to attempt repeat cardioversion or consider antiarrhythmic therapy or arrhythmia ablation. He underwent arthroscopic left knee surgery in August 2021 with excellent results. [name] reports that he started noticing swelling in his left leg in late July 2024.  He was initially seen in the emergency room locally on August 03, 2023 and then was admitted to the hospital for 5 days, beginning on 08/08/2023 for swelling and bleeding in his left leg.         Vitals:    01/09/24 0959   BP: (!) 169/96   BP Source: Arm, Left Upper   Pulse: 72   SpO2: 99%   O2 Device: None (Room air)   PainSc: Zero   Weight: 72.6 kg (160 lb)   Height: 177.8 cm (5' 10)     Body mass index is 22.96 kg/m?[name]     Past Medical History  Patient Active Problem List    Diagnosis Date Noted    PAF (paroxysmal atrial fibrillation) (HCC) 12/16/2023    Presence of Watchman left atrial appendage closure device 12/16/2023     12/16/2023 - Dr. Betti Cruz: Left atrial appendage occlusion with 27 mm Watchman FLX PRO device      Arthritis of left subtalar joint 02/22/2023    Paroxysmal atrial flutter (HCC) 07/27/2014    Left arm numbness 08/23/2010    Arrhythmia 08/23/2010    HTN (hypertension) 08/23/2010    Dizziness 08/15/2010    HTN (hypertension) 08/15/2010     08/09/2023 - ECHO:  No regional wall motion abnormalities are seen. Overall left ventricular systolic function appears normal. The estimated left ventricular ejection fraction is 60-65%.  Mild basal septal hypertrophy is noted. The Doppler recording obtained from the left ventricular outflow tract has normal velocity and profile and does not suggest resting left ventricular outflow tract obstruction.   The cardiac rhythm appears irregular. The monophasic mitral inflow profile and tissue doppler profile make interpretation of diastolic function difficult.   The right ventricle is not visualized well. Limited views suggest normal right ventricular size and contractility.  Mild biatrial enlargement.  The aortic valve appears considerably sclerotic but the Doppler exam does not suggest significant aortic valve stenosis. There is no evidence of significant valvular regurgitation or stenosis by doppler exam.  The aortic root and the visualized portions of the ascending aorta appear normal in size.  No pericardial effusion is seen.  The estimated peak systolic PA pressure = 52 mmHg.      Hyperlipoproteinemia 08/15/2010    Atrial fibrillation (HCC) 08/15/2010    Diabetes (HCC) 08/15/2010    BPH (benign prostatic hypertrophy) 08/15/2010    Dyslipidemia 08/15/2010         Review of Systems   Constitutional: Negative.   HENT: Negative.     Eyes: Negative.    Cardiovascular: Negative.    Respiratory: Negative.     Endocrine: Negative.    Hematologic/Lymphatic: Negative.    Skin: Negative.  Musculoskeletal: Negative.    Gastrointestinal: Negative.    Genitourinary: Negative.    Neurological: Negative.    Psychiatric/Behavioral: Negative.     Allergic/Immunologic: Negative.        Physical Exam  GENERAL: The patient is well developed, well nourished, resting comfortably and in no distress.    HEENT: No abnormalities of the visible oro-nasopharynx, conjunctiva or sclera are noted.    NECK: There is no jugular venous distension. Carotids are palpable and without bruits. There is no thyroid enlargement.    Chest: Lung fields are clear to auscultation. There are no wheezes or crackles.    CV: There is an irregular rhythm with an apical rate of 96 BPM. Variable intensity of S1. There are no murmurs, gallops or rubs.    ABD: The abdomen is soft and supple with normal bowel sounds. There is no hepatosplenomegaly, ascites, tenderness, masses or bruits. Ventral hernia noted.  Neuro: There are no focal motor defects. Ambulation is normal. Cognitive function appears normal.    Ext: There is trace to1+ bipedal edema without evidence of deep vein thrombosis. Peripheral pulses are satisfactory.    SKIN: There are no rashes and no cellulitis    PSYCH: The patient is calm, rationale and oriented    Cardiovascular Studies  A twelve-lead ECG obtained on 01/09/2024 shows permanent atrial fibrillation with a heart rate of 124 bpm.  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  Echo Doppler 08/09/2023:  Interpretation Summary  No regional wall motion abnormalities are seen. Overall left ventricular systolic function appears normal. The estimated left ventricular ejection fraction is 60-65%.   Mild basal septal hypertrophy is noted. The Doppler recording obtained from the left ventricular outflow tract has normal velocity and profile and does not suggest resting left ventricular outflow tract obstruction.   The cardiac rhythm appears irregular. The monophasic mitral inflow profile and tissue doppler profile make interpretation of diastolic function difficult.   The right ventricle is not visualized well. Limited views suggest normal right ventricular size and contractility.  Mild biatrial enlargement.  The aortic valve appears considerably sclerotic but the Doppler exam does not suggest significant aortic valve stenosis. There is no evidence of significant valvular regurgitation or stenosis by doppler exam.  The aortic root and the visualized portions of the ascending aorta appear normal in size.  No pericardial effusion is seen.  The estimated peak systolic PA pressure = 52 mmHg.  No other echocardiograms were available for comparison.    Cardiovascular Health Factors  Vitals BP Readings from Last 3 Encounters:   01/09/24 (!) 169/96   12/17/23 (!) 158/82   12/16/23 (!) 152/91     Wt Readings from Last 3 Encounters:   01/09/24 72.6 kg (160 lb)   12/17/23 74.4 kg (164 lb 0.4 oz)   12/16/23 74.4 kg (164 lb)     BMI Readings from Last 3 Encounters:   01/09/24 22.96 kg/m?   12/17/23 23.53 kg/m?   12/16/23 23.53 kg/m?      Smoking Social History     Tobacco Use   Smoking Status Never   Smokeless Tobacco Never      Lipid Profile Cholesterol   Date Value Ref Range Status   09/19/2022 114  Final     HDL   Date Value Ref Range Status   09/19/2022 35 (L) >40 Final     LDL   Date Value Ref Range Status   09/19/2022 60  Final  Triglycerides   Date Value Ref Range Status   09/19/2022 96  Final      Blood Sugar Hemoglobin A1C   Date Value Ref Range Status   08/29/2018 7.7 (H) 4.5 - 6.5 Final     Glucose   Date Value Ref Range Status   12/17/2023 191 (H) 70 - 100 mg/dL Final   16/09/9603 540 (H) 70 - 100 mg/dL Final   98/10/9146 829 (H) 70 - 105 Final     Glucose, POC   Date Value Ref Range Status   12/17/2023 165 (H) 70 - 100 mg/dL Final   56/21/3086 578 (H) 70 - 100 mg/dL Final   46/96/2952 841 (H) 70 - 100 mg/dL Final          Problems Addressed Today  Encounter Diagnoses   Name Primary?    Cardiovascular symptoms Yes    Paroxysmal atrial fibrillation (HCC)        Assessment and Plan   Generally MiguelHowe is doing very well from a cardiovascular perspective.  He feels well and reports no angina or congestive symptoms.  I did notice that his heart rate was higher on his ECG today and his blood pressure has been running higher on his blood pressure log.  Alternatives for therapy were reviewed with the patient and he wanted to start long-acting diltiazem 120 mg daily.  Possible adverse effects associate with diltiazem were reviewed with the patient and he was asked to report any worrisome symptoms.  I have asked him to report his blood pressure readings within the next month.  Cardiovascular risk factor modification was reviewed in detail.  I have asked him to remain active both mentally and physically but to take fall precautions.  I have asked him to return for follow-up in 6 months time to follow his progress. The total time spent during this interview and exam with preparation and chart review was 30 minutes         Current Medications (including today's revisions)   acetaminophen SR (TYLENOL 8 HOUR) 650 mg tablet Take one tablet by mouth daily.    amoxicillin (AMOXIL) 500 mg capsule Take four capsules by mouth as Needed. Take as needed for dental appt.    apixaban (ELIQUIS) 5 mg tablet Take one tablet by mouth twice daily.    atorvastatin (LIPITOR) 40 mg tablet Take 1 tablet by mouth once daily    betamethasone dipropionate (BETANATE) 0.05 % topical cream Apply  topically to affected area twice daily as needed.    clopiDOGreL (PLAVIX) 75 mg tablet Take one tablet by mouth daily. Indications: s/p Watchman    clotrimazole-betamethasone (LOTRISONE) 1-0.05 % topical cream Apply  topically to affected area daily as needed.    coenzyme Q10 200 mg capsule Take one capsule by mouth daily.    dilTIAZem CD (CARDIZEM CD) 120 mg capsule Take one capsule by mouth daily.    empagliflozin (JARDIANCE) 25 mg tablet Take one tablet by mouth daily.    finasteride (PROSCAR) 5 mg tablet Take one tablet by mouth at bedtime daily.      glimepiride (AMARYL) 4 mg tablet Take two tablets by mouth daily.      losartan (COZAAR) 50 mg tablet Take one tablet by mouth daily.    metoprolol tartrate 75 mg tablet Take 1 tablet by mouth twice daily    MULTIVITAMIN W-MINERALS/LUTEIN (CENTRUM SILVER PO) Take 1 tablet by mouth daily.    semaglutide (RYBELSUS) 7 mg tablet Take one tablet by mouth daily.

## 2024-01-29 ENCOUNTER — Encounter: Admit: 2024-01-29 | Discharge: 2024-01-29 | Payer: MEDICARE

## 2024-01-29 MED ORDER — LOSARTAN 50 MG PO TAB
50 mg | ORAL_TABLET | Freq: Every day | ORAL | 1 refills | 90.00000 days | Status: AC
Start: 2024-01-29 — End: ?

## 2024-03-04 ENCOUNTER — Encounter: Admit: 2024-03-04 | Discharge: 2024-03-04 | Payer: MEDICARE

## 2024-03-12 ENCOUNTER — Encounter: Admit: 2024-03-12 | Discharge: 2024-03-12 | Payer: MEDICARE

## 2024-03-12 ENCOUNTER — Ambulatory Visit: Admit: 2024-03-12 | Discharge: 2024-03-12 | Payer: MEDICARE

## 2024-03-12 DIAGNOSIS — I10 Essential (primary) hypertension: Secondary | ICD-10-CM

## 2024-03-12 DIAGNOSIS — I4821 Permanent atrial fibrillation: Secondary | ICD-10-CM

## 2024-03-12 DIAGNOSIS — Z95818 Presence of other cardiac implants and grafts: Secondary | ICD-10-CM

## 2024-03-12 DIAGNOSIS — E785 Hyperlipidemia, unspecified: Secondary | ICD-10-CM

## 2024-03-12 MED ORDER — LIDOCAINE (PF) 20 MG/ML (2 %) IJ SOLN
INTRAVENOUS | 0 refills | Status: DC
Start: 2024-03-12 — End: 2024-03-12

## 2024-03-12 MED ORDER — PROPOFOL 10 MG/ML IV EMUL 20 ML (INFUSION)(AM)(OR)
INTRAVENOUS | 0 refills | Status: DC
Start: 2024-03-12 — End: 2024-03-12
  Administered 2024-03-12: 16:00:00 125 ug/kg/min via INTRAVENOUS

## 2024-03-12 MED ORDER — SODIUM CHLORIDE 0.9 % IV SOLP (OR) 500ML
INTRAVENOUS | 0 refills | Status: DC
Start: 2024-03-12 — End: 2024-03-12

## 2024-03-12 MED ORDER — PROPOFOL INJ 10 MG/ML IV VIAL
INTRAVENOUS | 0 refills | Status: DC
Start: 2024-03-12 — End: 2024-03-12

## 2024-03-12 NOTE — Anesthesia Post-Procedure Evaluation
 Post-Anesthesia Evaluation    Name: Miguel Howe      MRN: [id number]     DOB: [birth year]     Age: 89 y.o.     Sex: male   __________________________________________________________________________     Procedure Information       Anesthesia Start Date/Time: 03/12/24 1032    Scheduled providers: Fortino Sic, MD; Kendall Flack, RN    Procedure: TRANSESOPHAGEAL ECHO    Location: Cardiovascular Medicine: [facility]            Post-Anesthesia Vitals    No vitals data found for the desired time range.  Vitals:    03/12/24 1121   BP: (!) 163/98   Pulse: 83   Temp:    SpO2: 98%   O2 Device: None (Room air)   O2 Liter Flow:            Post Anesthesia Evaluation Note      Patient participation: recovered; patient participated in evaluation  Level of consciousness: alert    Pain score: 0  Pain management: adequate    Hydration: normovolemia  Temperature: 36.0?C - 38.4?C  Airway patency: adequate    Perioperative Events       Post-op nausea and vomiting: no PONV    Postoperative Status  Cardiovascular status: hemodynamically stable  Respiratory status: spontaneous ventilation  Follow-up needed: none        Perioperative Events  There were no known complications for this encounter.

## 2024-03-12 NOTE — Progress Notes
 Cardiovascular Medicine     Date of Service: 03/12/2024    HPI       I had the pleasure of seeing Miguel Howe [facility]  for an updated history and physical prior to his 45 day post Watchman implantation TEE.    Miguel Howe is a 89 y.o. male with a past medical history of permanent atrial fibrillation, status post watchman implantation 12/16/2023, history of unsuccessful cardioversion 10/06/2015, prior stroke 2012, hypertension, dyslipidemia, diabetes mellitus, history of basal cell skin cancer status post resection and prior arthroscopic left knee surgery x 2.  In August 2024 experienced hemarthrosis in his knees.    Patient denies angina, dyspnea, orthopnea, PND, palpitations, near syncope, syncope, TIA and/or claudication symptoms.  He denies recent fever, chills, nausea and/or vomiting.  He denies difficulty swallowing.     [name] last ate  8 pm on 03/11/2024.    Miguel Howe, daughter,  will be his driver post procedure today.  Both daughters  Miguel Howe and Miguel Howe  and wife, Miguel Howe accompanied pt to visit today.      Documentation @ 45 days, 6 months, 1 year and 2 years post implant:  NYHA class: II  HAS-BLED score: HTN (1), Stroke (1), Bleeding History (1), and Elderly >65 (1)  -     HAS-BLED Yearly Risk: 4=8.7%    -     CHA2DS2-VASc Score: HTN (1), DM (1), Stroke, TIA, Thromboembolism (2), and >_ 75 (2)  -     CHA2DSVASc Yearly Stroke Risk: 6=9.8%         Vitals:    03/12/24 0906   BP: (!) 176/101   BP Source: Arm, Left Upper   Pulse: 93   SpO2: 98%   O2 Device: None (Room air)   PainSc: Zero   Weight: 72.4 kg (159 lb 9.6 oz)   Height: 177.8 cm (5' 10)     Body mass index is 22.9 kg/m?[name]     Past Medical History  Patient Active Problem List    Diagnosis Date Noted    PAF (paroxysmal atrial fibrillation) (CMS-HCC) 12/16/2023    Presence of Watchman left atrial appendage closure device 12/16/2023     12/16/2023 - Dr. 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 Overall left ventricular systolic function appears normal. The estimated left ventricular ejection fraction is 60-65%.  Mild basal septal hypertrophy is noted. The Doppler recording obtained from the left ventricular outflow tract has normal velocity and profile and does not suggest resting left ventricular outflow tract obstruction.   The cardiac rhythm appears irregular. The monophasic mitral inflow profile and tissue doppler profile make interpretation of diastolic function difficult.   The right ventricle is not visualized well. Limited views suggest normal right ventricular size and contractility.  Mild biatrial enlargement.  The aortic valve appears considerably sclerotic but the Doppler exam does not suggest significant aortic valve stenosis. There is no evidence of significant valvular regurgitation or stenosis by doppler exam.  The aortic root and the visualized portions of the ascending aorta appear normal in size.  No pericardial effusion is seen.  The  estimated peak systolic PA pressure = 52 mmHg.      Hyperlipoproteinemia 08/15/2010    Permanent atrial fibrillation (CMS-HCC) 08/15/2010    Diabetes (CMS-HCC) 08/15/2010    BPH (benign prostatic hypertrophy) 08/15/2010    Dyslipidemia 08/15/2010         ROS    Physical Exam:  General Appearance: no acute distress  Skin: warm & intact  Neck Veins: neck veins are flat & not distended  Chest Inspection: chest is normal in appearance  Auscultation/Percussion: lungs clear to auscultation, no rales, rhonchi, or wheezing  Cardiac Rhythm: regular rhythm & normal rate  Cardiac Auscultation: Normal S1 & S2.    Murmurs: no cardiac murmurs   Extremities: no lower extremity edema; 2+ symmetric distal pulses  Neurologic Exam: oriented to time, place and person; no focal neurologic deficits  Psychiatric: Normal mood and affect.  Behavior is normal. Judgment and thought content normal.        Cardiovascular Studies  Preliminary EKG from today:    Most recent results for 12-Lead ECG   ECG 12-LEAD    Collection Time: 01/09/24 10:12 AM   Result Value Status    VENTRICULAR RATE 124 Final    P-R INTERVAL  Final    QRS DURATION 130 Final    Q-T INTERVAL 360 Final    QTC CALCULATION (BAZETT) 517 Final    P AXIS  Final    R AXIS -88 Final    T AXIS 27 Final    Impression    Atrial fibrillation with rapid ventricular response  Right bundle branch block  Left anterior fascicular block  ** Bifascicular block **  Abnormal ECG  Confirmed by Miguel Howe ([id number]) on 01/09/2024 4:16:18 PM        The ASCVD Risk score (Arnett DK, et al., 2019) failed to calculate for the following reasons:    The 2019 ASCVD risk score is only valid for ages 29 to 64    Risk score cannot be calculated because patient has a medical history suggesting prior/existing ASCVD    Cardiovascular Health Factors  Vitals BP Readings from Last 3 Encounters:   03/12/24 (!) 176/101   01/09/24 (!) 169/96   12/17/23 (!) 158/82     Wt Readings from Last 3 Encounters:   03/12/24 72.4 kg (159 lb 9.6 oz)   01/09/24 72.6 kg (160 lb)   12/17/23 74.4 kg (164 lb 0.4 oz)     BMI Readings from Last 3 Encounters:   03/12/24 22.90 kg/m?   01/09/24 22.96 kg/m?   12/17/23 23.53 kg/m?      Smoking Social History     Tobacco Use   Smoking Status Never   Smokeless Tobacco Never      Lipid Profile Cholesterol   Date Value Ref Range Status   09/19/2022 114  Final     HDL   Date Value Ref Range Status   09/19/2022 35 (L) >40 Final     LDL   Date Value Ref Range Status   09/19/2022 60  Final     Triglycerides   Date Value Ref Range Status   09/19/2022 96  Final      Blood Sugar Hemoglobin A1C   Date Value Ref Range Status   08/29/2018 7.7 (H) 4.5 - 6.5 Final Glucose   Date Value Ref Range Status   12/17/2023 191 (H) 70 - 100 mg/dL Final   56/21/3086 578 (H) 70 - 100 mg/dL Final   46/96/2952 841 (H) 70 -  105 Final     Glucose, POC   Date Value Ref Range Status   12/17/2023 165 (H) 70 - 100 mg/dL Final   09/81/1914 782 (H) 70 - 100 mg/dL Final   95/62/1308 657 (H) 70 - 100 mg/dL Final          Problems Addressed Today  Encounter Diagnoses   Name Primary?    Primary hypertension Yes    Presence of Watchman left atrial appendage closure device     Dyslipidemia     Permanent atrial fibrillation (CMS-HCC)        Assessment and Plan       Permanent atrial fibrillation    Status post unsuccessful cardioversion 09/2015   Watchman implantation 12/16/2023  Recommend SBE prophylaxis 6 months status post watchman implantation  Recommend to proceed with 45-day post watchman implantation TEE  Miguel Howe's nursing team will contact the patient postprocedure to notify of any medication changes    2.. Hypertension   A.  Sub-optimally controlled; goal BP less than 130/80   B. Monitor blood pressure at home and call if consistently greater than 130/80   C.  Patient reports Bps at home range between 120s-130s/70s-80s        3..  Hyperlipidemia               A.  09/19/2022: HDL 35, LDL 60, trig 96-on atorvastatin 40 mg               B.  Well-controlled continue same medications    4.   No known coronary artery disease               A.  Regadenoson MPI stress test 07/01/2020: Normal study, LVEF 68% no high risk indicators               B. Without Angina      5.   Type 2 diabetes mellitus   A.  Followed by  Miguel Howe    Miguel Potter, APRN-NP      This note was partially dictated using Dragon Medical One speech recognition software.  Occasional wrong-word or sound-alike substitutions may have occurred due to the inherent limitations of voice-recognition software.  Please read the chart carefully and recognize, using context, where the substitutions may have occurred.  Please do not hesitate to contact me for clarification.            Current Medications (including today's revisions)   acetaminophen SR (TYLENOL 8 HOUR) 650 mg tablet Take one tablet by mouth daily.    amoxicillin (AMOXIL) 500 mg capsule Take four capsules by mouth as Needed. Take as needed for dental appt.    apixaban (ELIQUIS) 5 mg tablet Take one tablet by mouth twice daily.    atorvastatin (LIPITOR) 40 mg tablet Take 1 tablet by mouth once daily    betamethasone dipropionate (BETANATE) 0.05 % topical cream Apply  topically to affected area twice daily as needed.    clopiDOGreL (PLAVIX) 75 mg tablet Take one tablet by mouth daily. Indications: s/p Watchman    clotrimazole-betamethasone (LOTRISONE) 1-0.05 % topical cream Apply  topically to affected area daily as needed.    coenzyme Q10 200 mg capsule Take one capsule by mouth daily.    dilTIAZem CD (CARDIZEM CD) 120 mg capsule Take one capsule by mouth daily.    empagliflozin (JARDIANCE) 25 mg tablet Take one tablet by mouth daily.    finasteride (PROSCAR) 5 mg tablet Take one tablet  by mouth at bedtime daily.      glimepiride (AMARYL) 4 mg tablet Take two tablets by mouth daily.      losartan (COZAAR) 50 mg tablet Take 1 tablet by mouth once daily    metoprolol tartrate 75 mg tablet Take 1 tablet by mouth twice daily    MULTIVITAMIN W-MINERALS/LUTEIN (CENTRUM SILVER PO) Take 1 tablet by mouth daily.    semaglutide (RYBELSUS) 7 mg tablet Take one tablet by mouth daily.

## 2024-03-13 ENCOUNTER — Encounter: Admit: 2024-03-13 | Discharge: 2024-03-13

## 2024-03-13 NOTE — Telephone Encounter
 RC to pt w/ RAD recommendations to DC Eliquis and remain on Plavix 75mg  QD. Reviewed plan with the patient. Patient verbalized understanding and does not have any further questions or concerns. No further education requested from patient. Patient has our contact information for future needs.

## 2024-03-13 NOTE — Telephone Encounter
-----   Message from Apex S sent at 12/26/2023 12:51 PM CST -----  Regarding: RAD post Watchman med mgmt  Please call the patient to advise of medication changes after the post Watchman TEE has resulted.      FYI:  Dr. Betti Cruz did case for Dr. Wallene Huh d/t availability only.  Dr. Wallene Huh to manage post procedure care.    FYI:  If TEE is clear of thrombus or leak patient is to stay on PLAVIX as monotherapy for lifetime d/t his asa allergy.      Thanks

## 2024-03-17 ENCOUNTER — Encounter: Admit: 2024-03-17 | Discharge: 2024-03-17

## 2024-04-24 ENCOUNTER — Encounter: Admit: 2024-04-24 | Discharge: 2024-04-24 | Payer: MEDICARE

## 2024-04-24 MED ORDER — METOPROLOL TARTRATE 75 MG PO TAB
75 mg | ORAL_TABLET | Freq: Two times a day (BID) | ORAL | 3 refills | 90.00000 days | Status: AC
Start: 2024-04-24 — End: ?

## 2024-06-08 ENCOUNTER — Encounter: Admit: 2024-06-08 | Discharge: 2024-06-08 | Payer: MEDICARE

## 2024-06-08 ENCOUNTER — Ambulatory Visit: Admit: 2024-06-08 | Discharge: 2024-06-09 | Payer: MEDICARE

## 2024-06-08 DIAGNOSIS — R0989 Other specified symptoms and signs involving the circulatory and respiratory systems: Secondary | ICD-10-CM

## 2024-06-08 MED ORDER — METOPROLOL TARTRATE 100 MG PO TAB
100 mg | ORAL_TABLET | Freq: Two times a day (BID) | ORAL | 1 refills | 90.00000 days | Status: AC
Start: 2024-06-08 — End: ?

## 2024-06-08 MED ORDER — FUROSEMIDE 20 MG PO TAB
20 mg | ORAL_TABLET | Freq: Every day | ORAL | 0 refills | 90.00000 days | Status: AC
Start: 2024-06-08 — End: ?

## 2024-06-08 MED ORDER — POTASSIUM CHLORIDE 20 MEQ PO TBTQ
20 meq | ORAL_TABLET | Freq: Every day | ORAL | 0 refills | 30.00000 days | Status: AC
Start: 2024-06-08 — End: ?

## 2024-06-08 NOTE — Patient Instructions
-  Stop diltiazem    -Increase metoprolol  to 100 mg twice daily  -START lasix  (furosemide ) 20 mg and potassium 20 mEq once daily    -We will call you in 1 week to see if the changes helped with your swelling. If so, then we can go to lasix  and potassium as needed.     -We would like you to follow up with Dr. Berdine moving forward.    Contacting our office:    -For NON-URGENT questions please contact us  through your MyChart account.   -For all medication refills please contact your pharmacy or send a request through MyChart.     -For all questions that may need to be addressed urgently please call the nursing triage line at [phone] Monday - Friday 8-5 only. Please leave a detailed message with your name, date of birth, and reason for your call.  As long as you call before 3:30pm, you will receive a call back the same day. Please allow time for us  to review your chart prior to call back.     -Our fax number is [phone].    -Should you have an urgent concern over the weekend/nights that you do not believe warrants a visit to the emergency room, please contact [phone] for our on-call team.    Results & Testing Follow Up:    -Please allow 10-15 business days for the results of any testing to be reviewed. Please call our office if you have not heard from a nurse within this time frame.    -Should you choose to complete testing at an outside facility, please contact our office after completion of testing so that we can ensure that we have received results.    Lab and test results:  As a part of the CARES act, starting 03/17/2020, some results will be released to you via mychart immediately and automatically.  You may see results before your provider sees them; however, your provider will review all these results and then they, or one of their team, will notify you of result information and recommendations.   Critical results will be addressed immediately, but otherwise, please allow us  time to get back with you prior to you reaching out to us  for questions.  This will usually take about 72 hours for labs and 5-7 days for procedure test results.      -Thank you for allowing us  to take care of you today.    -You will receive a survey in the upcoming week from [facility]. Your feedback is important to us , and helps us  continue to improve patient care and patient satisfaction.     We know you have a choice and want to thank you for choosing [facility].

## 2024-06-08 NOTE — Progress Notes
 Date of Service: 06/08/2024    Miguel Howe  is a 89 y.o. male     Referred by:     HPI    Subjective         Miguel Howe is an 89 year old with paroxysmal atrial fibrillation who presents for a cardiac electrophysiology follow-up visit.    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 Miguel Howe has undergone unsuccessful cardioversion and has been on various anticoagulants, including Eliquis  and Pradaxa , but faced issues with increased bleeding risk, such as hemarthrosis into the knee joint. In December, Miguel Howe underwent a successful Watchman device implantation. Post-procedure, Miguel Howe was on Eliquis  for 45 days before switching to Plavix , which Miguel Howe is currently taking at 75 mg. A transesophageal echocardiogram in March 2025 showed the Watchman device was healing well with no leaks or clots, and his ejection fraction was normal at 55%.    Miguel Howe reports recent weight gain and leg swelling, which Miguel Howe attributes to increased chip consumption, denying increased salt intake from other sources. Miguel Howe has a history of leg swelling and purple discoloration, previously managed in the hospital. His weight has increased from 160 to 170 pounds recently. The swelling is characterized as 2+ pitting edema.    Miguel Howe has a history of hypertension and diabetes, managed with medications including Jardiance , Rybelsus, and glimepiride. After stopping glimepiride, his blood sugars worsened, leading to its reintroduction. Miguel Howe is concerned about the cost of his diabetes medications.    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 Miguel Howe has started using a stationary bike for exercise, currently doing eight minutes per session, which makes Miguel Howe feel tired but not short of breath.    His current medications include diltiazem  120 mg once daily, metoprolol  75 mg twice daily, losartan  once daily, and aspirin. Miguel Howe was recently started on diltiazem  in January.     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  CARDIOVASCULAR: Atrial fibrillation with controlled ventricular response at 65 bpm  EXTREMITIES: 2+ pitting edema in lower extremities            Assessment and Plan    Problems Addressed Today  Encounter Diagnoses   Name Primary?    Cardiovascular symptoms Yes            DIAGNOSTIC  Echocardiogram: Normal LV function (11/10/2023)  Transesophageal echocardiogram: Watchman healing well, no leaks, no clots, ejection fraction 55% (03/12/2024)  ECG: AFib with controlled ventricle response, 65 bpm (06/08/2024)    PROCEDURE  Watchman implantation: Successful deployment of Watchman Flexpro 27mm device (11/2023)               Paroxysmal atrial fibrillation  Status post watchman implantation in December 2024  Paroxysmal atrial fibrillation with a history of permanent atrial fibrillation for over ten years. Successful Watchman device implantation in December 2024. Transesophageal echocardiogram in March 2025 showed no leaks or clots, and  normal ejection fraction of 55%.  - Stop Plavix  on June 15, 2024, and switch to aspirin only for life..  - Schedule a one-year follow-up TEE in December 2025 to assess the Watchman device.    Possible chronic diastolic heart failure  Diastolic dysfunction could not be assessed due to atrial fibrillation in the past echocardiograms.  Most likely chronic diastolic heart failure with preserved ejection fraction. Symptoms include leg swelling and exercise intolerance. Recent weight gain of 10 pounds attributed to fluid retention. Diltiazem  potentially contributing to leg swelling.  - Discontinue diltiazem  due to potential contribution to leg swelling.  - Increase metoprolol  to 100 mg BID.  - Start Lasix  20 mg with potassium 20 mEq for one week trial.  - Monitor weight and report back in one week.    Leg swelling due to medication  Leg swelling potentially exacerbated by diltiazem , which was started in January 2025. Significant leg swelling and weight gain since August 2024.    Hypertension  Blood pressure was elevated in the office, likely due to white coat hypertension. Home blood pressure readings are within normal range.  - Monitor blood pressure at home.    Diabetes mellitus  Diabetes management includes Jardiance , Rybelsus, and glimepiride. Recent adjustment in medication regimen with glimepiride reintroduced due to elevated blood sugars after discontinuation. Cost concerns with Jardiance  and Rybelsus were discussed.  - Discuss with primary care physician the possibility of discontinuing either Jardiance  or Rybelsus.    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     Follow-up with Dr. Jude in August.  Miguel Howe will see Miguel Howe on an as-needed basis in the future.  Patient will call back to report if leg swelling is better.  In 1 week.  Miguel Howe can be on as needed Lasix  after that if Miguel Howe is back to baseline.  Miguel Howe is planning to get some baseline blood work with Dr. Venson in the near future          All results for 12-Lead ECG this visit   ECG 12-LEAD    Collection Time: 06/08/24 10:36 AM   Result Value Status    VENTRICULAR RATE 65 Incomplete    P-R INTERVAL  Incomplete    QRS DURATION 132 Incomplete    Q-T INTERVAL 428 Incomplete    QTC CALCULATION (BAZETT) 445 Incomplete    P AXIS  Incomplete    R AXIS -86 Incomplete    T AXIS -5 Incomplete    Impression    Atrial fibrillation  Right bundle branch block  Left anterior fascicular block  ** Bifascicular block **  Abnormal ECG  When compared with ECG of 09-Jan-2024 10:12,  Vent. rate has decreased by  59 bpm  Inverted T waves have replaced nonspecific T wave abnormality in Inferior leads        Thank you for letting us  participate in the care of your patient. Please feel free to contact us  if you have any questions or concerns.           Vitals:    06/08/24 1021   BP: (!) 157/88   BP Source: Arm, Left Upper   Pulse: 63   SpO2: 98%   O2 Device: None (Room air)   PainSc: Zero   Weight: 77.5 kg (170 lb 12.8 oz)   Height: 177.8 cm (5' 10)      Body mass index is 24.51 kg/m?SABRA     Past Medical History  Arthritis  Atrial fibrillation (CMS-HCC)  Back pain  BPH (benign prostatic hypertrophy)  Diabetes (CMS-HCC)  Dizziness  Dyslipidemia  HTN (hypertension)  HX: anticoagulation  Hyperlipoproteinemia  Stroke (CMS-HCC)     Review of Systems   Constitutional: Negative.   HENT: Negative.     Eyes: Negative.    Cardiovascular: Negative.    Respiratory: Negative.     Endocrine: Negative.    Hematologic/Lymphatic: Negative.    Skin: Negative.    Musculoskeletal: Negative.    Gastrointestinal: Negative.    Genitourinary: Negative.    Neurological: Negative.    Psychiatric/Behavioral: Negative.     Allergic/Immunologic: Negative.         Physical Exam     Patient is a moderately well-built gentleman who is comfortable at rest,  not in any distress.  Sclerae anicteric.  The oral mucosa is moist and pink.  Neck is  supple without any lymphadenopathy.  Lungs are clear to auscultation bilaterally.  Breath  sounds are normal.  Cardiac exam reveals normal S1, S2 with regular rate and  rhythm.  No murmurs, rubs or gallops noted.  Abdomen:  Soft, nontender, nondistended.  Bowel sounds are present.  Extremities:  No cyanosis, clubbing or edema.  Peripheral  pulses are symmetric.  Skin without any rash. . No gross motor or neuro deficits.    Cardiovascular Studies           Cardiovascular Health Factors  Vitals BP Readings from Last 3 Encounters:   06/08/24 (!) 157/88   03/12/24 (!) 163/98   03/12/24 (!) 176/101     Wt Readings from Last 3 Encounters:   06/08/24 77.5 kg (170 lb 12.8 oz)   03/12/24 72.6 kg (160 lb)   03/12/24 72.4 kg (159 lb 9.6 oz)     BMI Readings from Last 3 Encounters:   06/08/24 24.51 kg/m?   03/12/24 22.96 kg/m?   03/12/24 22.90 kg/m?      Smoking Social History     Tobacco Use   Smoking Status Never   Smokeless Tobacco Never      Lipid Profile Cholesterol   Date Value Ref Range Status   09/19/2022 114  Final     HDL   Date Value Ref Range Status   09/19/2022 35 (L) >40 Final     LDL   Date Value Ref Range Status   09/19/2022 60  Final     Triglycerides   Date Value Ref Range Status   09/19/2022 96  Final      Blood Sugar Hemoglobin A1C   Date Value Ref Range Status   08/29/2018 7.7 (H) 4.5 - 6.5 Final     Glucose   Date Value Ref Range Status   12/17/2023 191 (H) 70 - 100 mg/dL Final   87/76/7975 822 (H) 70 - 100 mg/dL Final   90/89/7975 855 (H) 70 - 105 Final     Glucose, POC   Date Value Ref Range Status   12/17/2023 165 (H) 70 - 100 mg/dL Final   87/69/7975 744 (H) 70 - 100 mg/dL Final   87/69/7975 785 (H) 70 - 100 mg/dL Final          Current Medications (including today's revisions)   acetaminophen  SR (TYLENOL  8 HOUR) 650 mg tablet Take one tablet by mouth daily.    amoxicillin (AMOXIL) 500 mg capsule Take four capsules by mouth as Needed. Take as needed for dental appt.    atorvastatin  (LIPITOR) 40 mg tablet Take 1 tablet  by mouth once daily    betamethasone dipropionate (BETANATE) 0.05 % topical cream Apply  topically to affected area twice daily as needed.    clopiDOGreL  (PLAVIX ) 75 mg tablet Take one tablet by mouth daily. Indications: s/p Watchman    clotrimazole-betamethasone (LOTRISONE) 1-0.05 % topical cream Apply  topically to affected area daily as needed.    coenzyme Q10 200 mg capsule Take one capsule by mouth daily.    empagliflozin  (JARDIANCE ) 25 mg tablet Take one tablet by mouth daily.    finasteride  (PROSCAR ) 5 mg tablet Take one tablet by mouth at bedtime daily.      glimepiride (AMARYL) 4 mg tablet Take two tablets by mouth daily.      losartan  (COZAAR ) 50 mg tablet Take 1 tablet by mouth once daily    metoprolol  tartrate (LOPRESSOR ) 100 mg tablet Take one tablet by mouth twice daily.    MULTIVITAMIN W-MINERALS/LUTEIN (CENTRUM SILVER PO) Take 1 tablet by mouth daily.    semaglutide (RYBELSUS) 7 mg tablet Take one tablet by mouth daily.    tamsulosin (FLOMAX) 0.4 mg capsule Take one capsule by mouth daily.

## 2024-06-15 ENCOUNTER — Encounter: Admit: 2024-06-15 | Discharge: 2024-06-15 | Payer: MEDICARE

## 2024-06-15 MED ORDER — FUROSEMIDE 20 MG PO TAB
20 mg | ORAL_TABLET | Freq: Every day | ORAL | 1 refills | 90.00000 days | Status: AC
Start: 2024-06-15 — End: ?

## 2024-06-15 MED ORDER — POTASSIUM CHLORIDE 20 MEQ PO TBTQ
20 meq | ORAL_TABLET | Freq: Every day | ORAL | 1 refills | 30.00000 days | Status: AC
Start: 2024-06-15 — End: ?

## 2024-06-15 NOTE — Telephone Encounter
-----   Message from GORMAN Alexander, MD sent at 06/15/2024 12:57 PM CDT -----  Regarding: RE: Follow up  To all: Continue same dosage and schedule him to see me.  Thanks.  SBG  ----- Message -----  From: Elaine Pierce, RN  Sent: 06/15/2024  12:52 PM CDT  To: Elspeth KATHEE Alexander, MD  Subject: FW: Follow up                                    Dr. Alexander,    I called Miguel Howe to follow up.  He states that his swelling is improved significantly.  His weight is down from 170 to 161.  He is still taking the lasix  and potassium daily and has 7 more tablets left of the prescription that Dr. Liborio sent in for him.  Please advise if you would like him to continue daily, or if you would like him to cut down his dosing.    Thank you for your help!  Pierce  ----- Message -----  From: Magdalen Drivers, RN  Sent: 06/15/2024   9:11 AM CDT  To: [facility] Nurse [place]  Subject: FW: Follow up                                      ----- Message -----  From: Delight Hila, RN  Sent: 06/15/2024   9:03 AM CDT  To: [facility] Nurse [facility] [place]  Subject: Follow up                                        Good morning Team [place],    Pt saw RAD in clinic on 6/23. Pt had lower extremity edema. Lasix  and potassium ordered. Wanted to send this on for follow up on edema. RAD said If improved, then pt could switch to PRN lasix  and potassium instead of daily. If edema persists please discuss with Dr. Alexander for next steps and further management.     Thank you!    Hila, RN

## 2024-06-15 NOTE — Telephone Encounter
 Called and discussed with patient.  He is agreeable to plan.  Sent in refills on lasix  and potassium.  Pt is scheduled for follow up with Dr. Debroah in August.

## 2024-07-24 ENCOUNTER — Encounter: Admit: 2024-07-24 | Discharge: 2024-07-24 | Payer: MEDICARE

## 2024-07-24 MED ORDER — LOSARTAN 50 MG PO TAB
50 mg | ORAL_TABLET | Freq: Every day | ORAL | 0 refills | 90.00000 days | Status: AC
Start: 2024-07-24 — End: ?

## 2024-08-06 ENCOUNTER — Encounter: Admit: 2024-08-06 | Discharge: 2024-08-06 | Payer: MEDICARE

## 2024-08-06 ENCOUNTER — Ambulatory Visit: Admit: 2024-08-06 | Discharge: 2024-08-06 | Payer: MEDICARE

## 2024-08-06 DIAGNOSIS — I4821 Permanent atrial fibrillation: Secondary | ICD-10-CM

## 2024-08-06 DIAGNOSIS — M1990 Unspecified osteoarthritis, unspecified site: Secondary | ICD-10-CM

## 2024-08-06 DIAGNOSIS — I10 Essential (primary) hypertension: Secondary | ICD-10-CM

## 2024-08-06 DIAGNOSIS — E785 Hyperlipidemia, unspecified: Principal | ICD-10-CM

## 2024-08-06 DIAGNOSIS — M2550 Pain in unspecified joint: Secondary | ICD-10-CM

## 2024-08-06 MED ORDER — METOPROLOL TARTRATE 100 MG PO TAB
100 mg | ORAL_TABLET | Freq: Two times a day (BID) | ORAL | 1 refills | 90.00000 days | Status: AC
Start: 2024-08-06 — End: ?

## 2024-08-06 MED ORDER — CLOPIDOGREL 75 MG PO TAB
75 mg | ORAL_TABLET | Freq: Every day | ORAL | 1 refills | 90.00000 days | Status: AC
Start: 2024-08-06 — End: ?

## 2024-08-06 NOTE — Patient Instructions
 Thank you for visiting our office today.    We would like to make the following medication adjustments:      CONTINUE Plavix        Otherwise continue the same medications as you have been doing.          We will be pursuing the following tests after your appointment today:       Orders Placed This Encounter    AMB REFERRAL TO RHEUMATOLOGY    clopiDOGreL  (PLAVIX ) 75 mg tablet         We will plan to see you back in 6 months.  Please call us  in the meantime with any questions or concerns.        Please allow 5-7 business days for our providers to review your results. All normal results will go to MyChart. If you do not have Mychart, it is strongly recommended to get this so you can easily view all your results. If you do not have mychart, we will attempt to call you once with normal lab and testing results. If we cannot reach you by phone with normal results, we will send you a letter.  If you have not heard the results of your testing after one week please give us  a call.       Your Cardiovascular Medicine [place] Team [place], [place] Pierce, [place], and [place])  phone number is [phone].

## 2024-08-06 NOTE — Progress Notes
 Date of Service: 08/06/2024    Miguel Howe is a 89 y.o. male.       HPI   [name] is followed for permanent atrial fibrillation.  He was having difficulty with bleeding in his left leg and underwent placement of a Watchman left atrial appendage occlusion device on December 16, 2023 with excellent results.  His chart indicates that he had a serious allergic reaction in the past to Aggrenox which is a combination of aspirin and dipyridamole.  Therefore, he has not taken aspirin in recent years.  As part of shared decision making with the patient and his medical team, it has been decided to maintain long-term clopidogrel  following Watchman implantation.  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  Currently he reports no angina, congestive symptoms, palpitations, sensation of sustained forceful heart pounding, lightheadedness or syncope. The patient reports no myalgias, new bleeding abnormalities, or strokelike symptoms.  He has been tolerating his current medications without difficulty.      Historically, [name] presented to medical attention on August 08, 2010 when he noticed unsteadiness and dizziness while playing golf. He reports that the first 9 holes of golf that he played were unremarkable, and that he became unsteady when he started playing the back 9. At the time he believes that he may have had some focal weakness in his left leg and some numbness in his left leg as well. This persisted only for 10-15 minutes and he believes that he did not mention it in the Emergency Department because he has intermittent sciatica and lower back pain and attributed the abnormality to his chronic back disorder. He stopped playing golf and went to the Emergency Department and was seen in the Emergency Department at approximately 12:45 that afternoon. He was noted to have supraventricular tachycardia with a rate of 168 beats per minute. The Emergency Department note indicated that his supraventricular tachycardia converted spontaneously with an involuntary Valsalva that occurred while an IV was being placed. [name] reported no other focal neurologic abnormalities. He reported no change in vision or difficulty with speech. 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 When I saw [name] on 08/15/10 his BP was elevated and he was started on losartan  50 mg daily. His BP was still elevated on follow-up and his losartan  was increased to 100 mg daily. On 08/23/10 he reported intermittent tingling involving his left hand and he was hospitalized at [facility] for stroke evaluation. Prior to discharge on 08/26/10 he was noted to have paroxysmal atrial flutter and he was started on Pradaxa . [name] reported some transient hematuria in October 2011. He reports that he was seen by his urologist and his hematuria resolved. When I saw [name] on 03/16/11 I noticed that his BP had been lower and he reported that he felt somewhat lethargic and orthostatic when his BP was lower. His Maxzide was stopped. Also his blood sugar was markedly elevated at 555 mg/dl. His metformin was increased and he markedly decreased his carbohydrate intake.   While [name] is unclear clinically whether he has had a  stroke, a CT head scan from 08/24/10 showed MILD INVOLUTIONAL CHANGES AND SMALL SUBACUTE TO CHRONIC RIGHT BASAL GANGLIA LACUNAR INFARCT. In addition, the neurology discharge note from 08/27/10 lists subacute to chronic stroke as a discharge diagnosis. This along with age, hypertension and diabetes would yield a CHA2DS2-VASc score of 6. 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  When he was seen on 10/06/15 he was noted to be in atrial fibrillation. Treatment alternatives were presented to the patient and he wanted to proceed with cardioversion. Cardioversion was attempted on 10/10/15 but was unsuccessful.  He has selected a strategy of heart rate control and anticoagulation and does not want to attempt repeat cardioversion or consider antiarrhythmic therapy or arrhythmia ablation. He underwent arthroscopic left knee surgery in August 2021 with excellent results. [name] reports that he started noticing swelling in his left leg in late July 2024.  He was initially seen in the emergency room locally on August 03, 2023 and then was admitted to the hospital for 5 days, beginning on 08/08/2023 for swelling and bleeding in his left leg.         Vitals:    08/06/24 1001   BP: (!) 140/80   BP Source: Arm, Left Upper   Pulse: 60   SpO2: 99%   O2 Device: None (Room air)   PainSc: Zero   Weight: 72 kg (158 lb 12.8 oz)   Height: 177.8 cm (5' 10)     Body mass index is 22.79 kg/m?SABRA     Past Medical History  Patient Active Problem List    Diagnosis Date Noted    PAF (paroxysmal atrial fibrillation) (CMS-HCC) 12/16/2023    Presence of Watchman left atrial appendage closure device 12/16/2023     12/16/2023 - Dr. Jess: Left atrial appendage occlusion with 27 mm Watchman FLX PRO device      Arthritis of left subtalar joint 02/22/2023    Paroxysmal atrial flutter (CMS-HCC) 07/27/2014    Left arm numbness 08/23/2010    Arrhythmia 08/23/2010    HTN (hypertension) 08/23/2010    Dizziness 08/15/2010    HTN (hypertension) 08/15/2010     08/09/2023 - ECHO:  No regional wall motion abnormalities are seen. Overall left ventricular systolic function appears normal. The estimated left ventricular ejection fraction is 60-65%.  Mild basal septal hypertrophy is noted. The Doppler recording obtained from the left ventricular outflow tract has normal velocity and profile and does not suggest resting left ventricular outflow tract obstruction.   The cardiac rhythm appears irregular. The monophasic mitral inflow profile and tissue doppler profile make interpretation of diastolic function difficult.   The right ventricle is not visualized well. Limited views suggest normal right ventricular size and contractility.  Mild biatrial enlargement.  The aortic valve appears considerably sclerotic but the Doppler exam does not suggest significant aortic valve stenosis. There is no evidence of significant valvular regurgitation or stenosis by doppler exam.  The aortic root and the visualized portions of the ascending aorta appear normal in size.  No pericardial effusion is seen.  The estimated peak systolic PA pressure = 52 mmHg.      Hyperlipoproteinemia 08/15/2010    Permanent atrial fibrillation (CMS-HCC) 08/15/2010    Diabetes (CMS-HCC) 08/15/2010    BPH (benign prostatic hypertrophy) 08/15/2010    Dyslipidemia 08/15/2010         Review of Systems   Constitutional: Negative.   HENT: Negative.  Eyes: Negative.    Cardiovascular: Negative.    Respiratory: Negative.     Endocrine: Negative.    Hematologic/Lymphatic: Negative.    Skin: Negative.    Musculoskeletal: Negative.    Gastrointestinal: Negative.    Genitourinary: Negative.    Neurological: Negative.    Psychiatric/Behavioral: Negative.     Allergic/Immunologic: Negative.        Physical Exam  GENERAL: The patient is well developed, well nourished, resting comfortably and in no distress.    HEENT: No abnormalities of the visible oro-nasopharynx, conjunctiva or sclera are noted.    NECK: There is no jugular venous distension. Carotids are palpable and without bruits. There is no thyroid enlargement.    Chest: Lung fields are clear to auscultation. There are no wheezes or crackles.    CV: There is an irregular rhythm with an apical rate of 96 BPM. Variable intensity of S1. There are no murmurs, gallops or rubs.    ABD: The abdomen is soft and supple with normal bowel sounds. There is no hepatosplenomegaly, ascites, tenderness, masses or bruits. Ventral hernia noted.  Neuro: There are no focal motor defects. Ambulation is normal. Cognitive function appears normal.    Ext: There is trace to1+ bipedal edema without evidence of deep vein thrombosis. Peripheral pulses are satisfactory.    SKIN: There are no rashes and no cellulitis    PSYCH: The patient is calm, rationale and oriented    Cardiovascular Studies  A twelve-lead ECG obtained on 01/09/2024 shows permanent atrial fibrillation with a heart rate of 124 bpm.  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  Echo Doppler 08/09/2023:  Interpretation Summary  No regional wall motion abnormalities are seen. Overall left ventricular systolic function appears normal. The estimated left ventricular ejection fraction is 60-65%.   Mild basal septal hypertrophy is noted. The Doppler recording obtained from the left ventricular outflow tract has normal velocity and profile and does not suggest resting left ventricular outflow tract obstruction.   The cardiac rhythm appears irregular. The monophasic mitral inflow profile and tissue doppler profile make interpretation of diastolic function difficult.   The right ventricle is not visualized well. Limited views suggest normal right ventricular size and contractility.  Mild biatrial enlargement.  The aortic valve appears considerably sclerotic but the Doppler exam does not suggest significant aortic valve stenosis. There is no evidence of significant valvular regurgitation or stenosis by doppler exam.  The aortic root and the visualized portions of the ascending aorta appear normal in size.  No pericardial effusion is seen.  The estimated peak systolic PA pressure = 52 mmHg.  No other echocardiograms were available for comparison.  Trans esophageal echo 03/12/2024:  Interpretation Summary Well-seated, well-positioned Watchman left atrial appendage closure device.  No significant peridevice leak or device related thrombus.    Normal biventricular systolic function.    No hemodynamically significant valve dysfunction.    No pericardial effusion.     Compared to prior study from 11/2023 no significant changes have occurred.     Cardiovascular Health Factors  Vitals BP Readings from Last 3 Encounters:   08/06/24 (!) 140/80   06/08/24 (!) 157/88   03/12/24 (!) 163/98     Wt Readings from Last 3 Encounters:   08/06/24 72 kg (158 lb 12.8 oz)   06/08/24 77.5 kg (170 lb 12.8 oz)   03/12/24 72.6 kg (160 lb)     BMI Readings from Last 3 Encounters:   08/06/24 22.79 kg/m?  06/08/24 24.51 kg/m?   03/12/24 22.96 kg/m?      Smoking Tobacco Use History[1]   Lipid Profile Cholesterol   Date Value Ref Range Status   09/19/2022 114  Final     HDL   Date Value Ref Range Status   09/19/2022 35 (L) >40 Final     LDL   Date Value Ref Range Status   09/19/2022 60  Final     Triglycerides   Date Value Ref Range Status   09/19/2022 96  Final      Blood Sugar Hemoglobin A1C   Date Value Ref Range Status   08/29/2018 7.7 (H) 4.5 - 6.5 Final     Glucose   Date Value Ref Range Status   12/17/2023 191 (H) 70 - 100 mg/dL Final   87/76/7975 822 (H) 70 - 100 mg/dL Final   90/89/7975 855 (H) 70 - 105 Final     Glucose, POC   Date Value Ref Range Status   12/17/2023 165 (H) 70 - 100 mg/dL Final   87/69/7975 744 (H) 70 - 100 mg/dL Final   87/69/7975 785 (H) 70 - 100 mg/dL Final          Problems Addressed Today  Permanent atrial fibrillation    Assessment and Plan   Generally Mr.Hanselman is doing very well from a cardiovascular perspective.  He feels well and reports no angina or congestive symptoms.  His exercise tolerance is limited by arthritis in his ankles and not by any cardiac symptoms.  It is a shame because he is to be at champion horseshoe player.  I have referred him to arthritis/rheumatology clinic at [facility].  Cardiovascular risk factor modification was reviewed in detail.  As mentioned above, he plans to continue clopidogrel  long-term following implantation of the Watchman left atrial appendage occlusion device because of an allergy to aspirin.  I have asked him to remain active both mentally and physically but to take fall precautions.  I have asked him to return for follow-up in 6 months time to follow his progress. The total time spent during this interview and exam with preparation and chart review was 30 minutes          Current Medications (including today's revisions)   acetaminophen  SR (TYLENOL  8 HOUR) 650 mg tablet Take one tablet by mouth daily.    amoxicillin (AMOXIL) 500 mg capsule Take four capsules by mouth as Needed. Take as needed for dental appt.    atorvastatin  (LIPITOR) 40 mg tablet Take 1 tablet by mouth once daily    betamethasone dipropionate (BETANATE) 0.05 % topical cream Apply  topically to affected area twice daily as needed. (Patient not taking: Reported on 08/06/2024)    clopiDOGreL  (PLAVIX ) 75 mg tablet Take one tablet by mouth daily. Indications: s/p Watchman    clotrimazole-betamethasone (LOTRISONE) 1-0.05 % topical cream Apply  topically to affected area daily as needed.    coenzyme Q10 200 mg capsule Take one capsule by mouth daily.    diclofenac sodium (VOLTAREN) 1 % topical gel Apply four g topically to affected area four times daily.    empagliflozin  (JARDIANCE ) 25 mg tablet Take one tablet by mouth daily.    finasteride  (PROSCAR ) 5 mg tablet Take one tablet by mouth at bedtime daily.      furosemide  (LASIX ) 20 mg tablet Take one tablet by mouth daily. (Patient not taking: Reported on 08/06/2024)    glimepiride (AMARYL) 4 mg tablet Take two tablets by mouth daily.  losartan  (COZAAR ) 50 mg tablet Take 1 tablet by mouth once daily (Patient not taking: Reported on 08/06/2024)    metoprolol  tartrate (LOPRESSOR ) 100 mg tablet Take one tablet by mouth twice daily. (Patient not taking: Reported on 08/06/2024) metoprolol  tartrate (LOPRESSOR ) 50 mg tablet Take 1.5 tablets by mouth twice daily.    MULTIVITAMIN W-MINERALS/LUTEIN (CENTRUM SILVER PO) Take 1 tablet by mouth daily.    potassium chloride  SR (K-DUR) 20 mEq tablet Take one tablet by mouth daily. (Patient taking differently: Take one-half tablet by mouth daily.)    semaglutide (RYBELSUS) 7 mg tablet Take one tablet by mouth daily.    tamsulosin (FLOMAX) 0.4 mg capsule Take one capsule by mouth daily.                 [1]   Social History  Tobacco Use   Smoking Status Never   Smokeless Tobacco Never

## 2024-10-18 ENCOUNTER — Encounter: Admit: 2024-10-18 | Discharge: 2024-10-18 | Payer: MEDICARE

## 2024-10-18 MED ORDER — LOSARTAN 50 MG PO TAB
50 mg | ORAL_TABLET | Freq: Every day | ORAL | 0 refills | 90.00000 days | Status: AC
Start: 2024-10-18 — End: ?

## 2024-10-31 ENCOUNTER — Encounter: Admit: 2024-10-31 | Discharge: 2024-10-31 | Payer: MEDICARE

## 2024-11-14 ENCOUNTER — Encounter: Admit: 2024-11-14 | Discharge: 2024-11-14 | Payer: MEDICARE

## 2024-11-26 ENCOUNTER — Encounter: Admit: 2024-11-26 | Discharge: 2024-11-26 | Payer: MEDICARE

## 2024-11-26 MED ORDER — POTASSIUM CHLORIDE 20 MEQ PO TBTQ
20 meq | ORAL_TABLET | Freq: Every day | ORAL | 0 refills | 30.00000 days | Status: AC
Start: 2024-11-26 — End: ?

## 2024-11-28 ENCOUNTER — Encounter: Admit: 2024-11-28 | Discharge: 2024-11-28 | Payer: MEDICARE

## 2024-12-01 ENCOUNTER — Encounter: Admit: 2024-12-01 | Discharge: 2024-12-01 | Payer: MEDICARE

## 2024-12-01 DIAGNOSIS — I4821 Permanent atrial fibrillation: Principal | ICD-10-CM

## 2024-12-01 DIAGNOSIS — Z95818 Presence of other cardiac implants and grafts: Secondary | ICD-10-CM

## 2024-12-01 NOTE — Telephone Encounter [36]
 From: Hezekiah Shuck, LPN   Sent: 87/83/7974   3:33 PM CST   To: Cvm Nurse Ep Team C   Subject: RAD- RC                                          RC from patient at 2:59 and 3:02pm to schedule f/u TEE.   Call him at #[phone].     Called and spoke with patient. Confirmed with patient plan for 1 year post watchman TEE. Patient agreeable to 01/25/25 at 10 AM office visit with TEE to follow. This RN Will get instructions together and mail to patient. Patient verbalized understanding.

## 2024-12-01 NOTE — Progress Notes [1]
 Transesophageal Echocardiogram and/or Cardioversion  Patient Pre-Procedure Instructions    Patient Name: Miguel Howe  MRN#: [id number]  Date of Birth: 04-May-1934 (89 y.o.)  Today's Date: 12/01/2024    PROCEDURE:  You are scheduled for a Transesophageal echo.    ANTICIPATED PROCEDURE DATE AND ARRIVAL TIME:  Your procedure date is 01/25/2025     Your procedure time is 1:00 PM.    Please arrive 1 hour prior to your appointment time. ARRIVE AT 12:00 PM  Your procedure time is subject to change based on availability, if your appointment is changed, you will be contacted.    Please check in at [facility] Floor Cardiology ([id number]).  Address:  [address] [place], [place] [address]    (Enter main hospital entrance.  The cardiology clinic is directly to the right of the information desk)    PRE-PROCEDURE HISTORY & PHYSICAL:  Your 30-day Pre-Procedure History and Physical is scheduled for 01/25/25 at 10:00 am with Regan, PA.    FOOD AND DRINK INSTRUCTIONS  You are either taking an oral or an injectable GLP-1 medication.    **Day prior to your procedure: CLEAR LIQUIDS ONLY until 11:00p.m. No solid foods. Chewing gum, mints, and hard candy (e.g. jolly ranchers) are acceptable while on a clear liquid diet. They cannot have milk or nut products in them.    **Day of your procedure: Nothing to eat or drink. Sips of water  with medications only. No chewing gum, mints, or candy.    Examples of acceptable clear liquids:  -Water   -Ice chips  -Carbonated beverages (caffeine-free)  -Gatorade or other carbohydrate sports drinks (caffeine-free)  -Clear ice pops (without fruit or yogurt)  -Clear juice (apple, grape, or cranberry - no juice with pulp)  -Pedialyte   (Nothing above should have creamer, milk, or pulp. Do NOT drink clear protein drinks, broth, or jello.)    MEDICATION INSTRUCTIONS    TAKE MORNING OF PROCEDURE with a sip of water   All other prescribed medications    HOLD MEDICATIONS  Diuretics: Lasix  (furosemide ) -- hold the morning of your procedure.   Hypoglycemics: glimepiride (Amaryl) -- hold the morning of your procedure.  and empagliflozin  (Jardiance ) -- hold for 72 hours prior to your procedure. LAST DOSE: 12/21/24  GLP-1 Medications: semaglutide (Wegovy, Ozempic, Rybelsus): Hold the morning of your procedure.  ACE/ARBs: Cozaar  (losartan ) -- hold the morning of your procedure.     HOLD ALL erectile dysfunction medications for 3 days, unless prescribed for pulmonary hypertension.     HOLD ALL over the counter vitamins or supplements on the morning of your procedure.    Additional Instructions  If you have an implanted nerve stimulator device, please bring the controller.  Your device will be turned off prior to cardioversion.    Bring photo ID and your health insurance card(s).    Arrange for a driver to take you home from the hospital. Please arrange for a friend or family member to take you home from this procedure. You cannot take a taxi, [place] or public transportation, as there must be a responsible person to help care for you after sedation.    Bring an accurate list of your current medications with you to the hospital (all medications and supplements taken daily).  Please use the medication list below and write in the date and time when you took your last dose before your procedure. Update this list of medications as needed.      Wear comfortable clothes and  don't bring valuables, other than photo identification card, with you to the hospital.    Do not apply lotions or oils (ex. Vaseline, baby oil, face lotion) to chest/stomach day of procedure.    Please review your pre-procedure instructions and bring them with you on the day of your procedure. Call the procedure nurse office at [phone] with any questions between the hours of 7 a.m.- 5 p.m. Monday through Friday.      CURRENT MEDICATIONS  Encounter Medications[1]    _________________________________________  Form completed by: Elayne Shed, BSN  Date completed: 12/01/24  Method: Via telephone and mailed to the patient.         [1]   Outpatient Encounter Medications as of 12/01/2024   Medication Sig Dispense Refill    acetaminophen  SR (TYLENOL  8 HOUR) 650 mg tablet Take one tablet by mouth daily.      amoxicillin (AMOXIL) 500 mg capsule Take four capsules by mouth as Needed. Take as needed for dental appt.      atorvastatin  (LIPITOR) 40 mg tablet Take 1 tablet by mouth once daily 90 tablet 3    betamethasone dipropionate (BETANATE) 0.05 % topical cream Apply  topically to affected area twice daily as needed. (Patient not taking: Reported on 08/06/2024)      clopiDOGreL  (PLAVIX ) 75 mg tablet Take one tablet by mouth daily. Indications: s/p Watchman 90 tablet 1    clotrimazole-betamethasone (LOTRISONE) 1-0.05 % topical cream Apply  topically to affected area daily as needed.      coenzyme Q10 200 mg capsule Take one capsule by mouth daily.      diclofenac sodium (VOLTAREN) 1 % topical gel Apply four g topically to affected area four times daily.      empagliflozin  (JARDIANCE ) 25 mg tablet Take one tablet by mouth daily.      finasteride  (PROSCAR ) 5 mg tablet Take one tablet by mouth at bedtime daily.        furosemide  (LASIX ) 20 mg tablet Take 1 tablet by mouth once daily 90 tablet 3    glimepiride (AMARYL) 4 mg tablet Take two tablets by mouth daily.        losartan  (COZAAR ) 50 mg tablet Take 1 tablet by mouth once daily 90 tablet 0    metoprolol  tartrate (LOPRESSOR ) 100 mg tablet Take one tablet by mouth twice daily. 180 tablet 1    MULTIVITAMIN W-MINERALS/LUTEIN (CENTRUM SILVER PO) Take 1 tablet by mouth daily.      potassium chloride  SR (K-DUR) 20 mEq tablet Take 1 tablet by mouth once daily 90 tablet 0    semaglutide (RYBELSUS) 7 mg tablet Take one tablet by mouth daily.      tamsulosin (FLOMAX) 0.4 mg capsule Take one capsule by mouth daily.       No facility-administered encounter medications on file as of 12/01/2024.

## 2024-12-01 NOTE — Telephone Encounter [36]
 Called x2 to patient, left VM after second call. In message stated need to discuss further follow up for 1 year post watchman implant. Gave number to CB    Called and LVM for patient spouse, Alexine to have [place] call us  back.     Looking to schedule OV 01/25/25 with Regan, PA at 10 AM (Regan approved overbook) with TEE To follow at 1 pm, (12 pm arrival).

## 2024-12-01 NOTE — Telephone Encounter [36]
-----   Message from Alvarado S sent at 08/29/2024 12:40 PM CDT -----  Regarding: RAD 1 yr post Watchman  Please call the patient to schedule their 1 yr post Watchman office visit and TEE.      The office visit should be scheduled with an EP APP or the implanting physician (Dr. Kathi Dendi).      The Watchman was completed on 12/16/2023.      The 1 year post procedure visits should be scheduled as close to 1 year post procedure as possible.  However, they can be scheduled +/- 60 days from the 1 year post procedure date per registry requirements.

## 2024-12-02 ENCOUNTER — Encounter: Admit: 2024-12-02 | Discharge: 2024-12-02 | Payer: MEDICARE

## 2024-12-26 ENCOUNTER — Encounter: Admit: 2024-12-26 | Discharge: 2024-12-26 | Payer: MEDICARE

## 2025-01-18 ENCOUNTER — Encounter: Admit: 2025-01-18 | Discharge: 2025-01-18 | Payer: MEDICARE
